# Patient Record
Sex: Male | Born: 1970 | Race: White | Hispanic: No | Smoking: Never smoker
Health system: Southern US, Community
[De-identification: ages and names within clinical notes are randomized; demographics above are authoritative.]

## PROBLEM LIST (undated history)

## (undated) DIAGNOSIS — F32A Depression, unspecified: Secondary | ICD-10-CM

## (undated) DIAGNOSIS — E559 Vitamin D deficiency, unspecified: Secondary | ICD-10-CM

## (undated) DIAGNOSIS — F329 Major depressive disorder, single episode, unspecified: Secondary | ICD-10-CM

## (undated) DIAGNOSIS — K219 Gastro-esophageal reflux disease without esophagitis: Secondary | ICD-10-CM

## (undated) DIAGNOSIS — K449 Diaphragmatic hernia without obstruction or gangrene: Secondary | ICD-10-CM

## (undated) DIAGNOSIS — M109 Gout, unspecified: Secondary | ICD-10-CM

## (undated) DIAGNOSIS — K2210 Ulcer of esophagus without bleeding: Secondary | ICD-10-CM

## (undated) DIAGNOSIS — R5383 Other fatigue: Secondary | ICD-10-CM

## (undated) DIAGNOSIS — F419 Anxiety disorder, unspecified: Secondary | ICD-10-CM

## (undated) DIAGNOSIS — K635 Polyp of colon: Secondary | ICD-10-CM

## (undated) DIAGNOSIS — R55 Syncope and collapse: Secondary | ICD-10-CM

## (undated) DIAGNOSIS — R519 Headache, unspecified: Secondary | ICD-10-CM

## (undated) DIAGNOSIS — E538 Deficiency of other specified B group vitamins: Secondary | ICD-10-CM

## (undated) HISTORY — DX: Other fatigue: R53.83

## (undated) HISTORY — DX: Syncope and collapse: R55

## (undated) HISTORY — DX: Deficiency of other specified B group vitamins: E53.8

## (undated) HISTORY — DX: Headache, unspecified: R51.9

## (undated) HISTORY — DX: Ulcer of esophagus without bleeding: K22.10

## (undated) HISTORY — DX: Depression, unspecified: F32.A

## (undated) HISTORY — DX: Diaphragmatic hernia without obstruction or gangrene: K44.9

## (undated) HISTORY — DX: Anxiety disorder, unspecified: F41.9

## (undated) HISTORY — DX: Polyp of colon: K63.5

## (undated) HISTORY — DX: Major depressive disorder, single episode, unspecified: F32.9

## (undated) HISTORY — PX: COLONOSCOPY: SHX174

## (undated) HISTORY — DX: Vitamin D deficiency, unspecified: E55.9

## (undated) HISTORY — DX: Gastro-esophageal reflux disease without esophagitis: K21.9

## (undated) HISTORY — DX: Gout, unspecified: M10.9

---

## 2003-04-02 ENCOUNTER — Inpatient Hospital Stay (HOSPITAL_COMMUNITY): Admission: EM | Admit: 2003-04-02 | Discharge: 2003-04-05 | Payer: Self-pay | Admitting: Psychiatry

## 2004-03-26 ENCOUNTER — Ambulatory Visit: Payer: Self-pay | Admitting: Psychiatry

## 2004-05-23 ENCOUNTER — Ambulatory Visit: Payer: Self-pay | Admitting: Psychiatry

## 2004-07-18 ENCOUNTER — Ambulatory Visit: Payer: Self-pay | Admitting: Psychiatry

## 2004-10-10 ENCOUNTER — Ambulatory Visit: Payer: Self-pay | Admitting: Psychiatry

## 2005-01-02 ENCOUNTER — Ambulatory Visit: Payer: Self-pay | Admitting: Psychiatry

## 2005-02-13 ENCOUNTER — Ambulatory Visit: Payer: Self-pay | Admitting: Psychology

## 2005-04-03 ENCOUNTER — Ambulatory Visit: Payer: Self-pay | Admitting: Psychiatry

## 2005-06-03 ENCOUNTER — Ambulatory Visit: Payer: Self-pay | Admitting: Psychiatry

## 2005-08-28 ENCOUNTER — Ambulatory Visit (HOSPITAL_COMMUNITY): Payer: Self-pay | Admitting: Psychiatry

## 2008-01-20 ENCOUNTER — Ambulatory Visit (HOSPITAL_COMMUNITY): Payer: Self-pay | Admitting: Psychiatry

## 2008-02-01 ENCOUNTER — Ambulatory Visit (HOSPITAL_COMMUNITY): Payer: Self-pay | Admitting: Psychiatry

## 2008-03-01 ENCOUNTER — Ambulatory Visit (HOSPITAL_COMMUNITY): Payer: Self-pay | Admitting: Psychology

## 2008-03-15 ENCOUNTER — Ambulatory Visit (HOSPITAL_COMMUNITY): Payer: Self-pay | Admitting: Psychology

## 2008-03-23 ENCOUNTER — Ambulatory Visit (HOSPITAL_COMMUNITY): Payer: Self-pay | Admitting: Psychiatry

## 2008-04-11 ENCOUNTER — Ambulatory Visit (HOSPITAL_COMMUNITY): Payer: Self-pay | Admitting: Psychiatry

## 2008-04-13 ENCOUNTER — Ambulatory Visit (HOSPITAL_COMMUNITY): Payer: Self-pay | Admitting: Psychology

## 2008-06-08 ENCOUNTER — Ambulatory Visit (HOSPITAL_COMMUNITY): Payer: Self-pay | Admitting: Psychiatry

## 2008-07-06 ENCOUNTER — Ambulatory Visit (HOSPITAL_COMMUNITY): Payer: Self-pay | Admitting: Psychiatry

## 2008-08-31 ENCOUNTER — Ambulatory Visit (HOSPITAL_COMMUNITY): Payer: Self-pay | Admitting: Psychiatry

## 2008-10-31 ENCOUNTER — Ambulatory Visit (HOSPITAL_COMMUNITY): Payer: Self-pay | Admitting: Psychiatry

## 2008-12-28 ENCOUNTER — Ambulatory Visit (HOSPITAL_COMMUNITY): Payer: Self-pay | Admitting: Psychiatry

## 2009-04-12 ENCOUNTER — Ambulatory Visit (HOSPITAL_COMMUNITY): Payer: Self-pay | Admitting: Psychiatry

## 2009-06-12 ENCOUNTER — Ambulatory Visit (HOSPITAL_COMMUNITY): Payer: Self-pay | Admitting: Psychiatry

## 2009-07-12 ENCOUNTER — Ambulatory Visit (HOSPITAL_COMMUNITY): Payer: Self-pay | Admitting: Psychiatry

## 2009-08-09 ENCOUNTER — Ambulatory Visit (HOSPITAL_COMMUNITY): Payer: Self-pay | Admitting: Psychiatry

## 2009-09-27 ENCOUNTER — Ambulatory Visit (HOSPITAL_COMMUNITY): Payer: Self-pay | Admitting: Psychiatry

## 2009-11-22 ENCOUNTER — Ambulatory Visit (HOSPITAL_COMMUNITY): Payer: Self-pay | Admitting: Psychiatry

## 2010-01-22 ENCOUNTER — Ambulatory Visit (HOSPITAL_COMMUNITY): Payer: Self-pay | Admitting: Psychiatry

## 2010-04-30 ENCOUNTER — Ambulatory Visit (HOSPITAL_COMMUNITY): Payer: Self-pay | Admitting: Psychiatry

## 2010-07-02 ENCOUNTER — Encounter (INDEPENDENT_AMBULATORY_CARE_PROVIDER_SITE_OTHER): Payer: Self-pay | Admitting: Psychiatry

## 2010-07-02 DIAGNOSIS — F331 Major depressive disorder, recurrent, moderate: Secondary | ICD-10-CM

## 2010-08-29 ENCOUNTER — Encounter (INDEPENDENT_AMBULATORY_CARE_PROVIDER_SITE_OTHER): Payer: 59 | Admitting: Psychiatry

## 2010-08-29 DIAGNOSIS — F331 Major depressive disorder, recurrent, moderate: Secondary | ICD-10-CM
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
## 2010-10-29 ENCOUNTER — Encounter (INDEPENDENT_AMBULATORY_CARE_PROVIDER_SITE_OTHER): Payer: 59 | Admitting: Psychiatry

## 2010-10-29 DIAGNOSIS — F331 Major depressive disorder, recurrent, moderate: Secondary | ICD-10-CM

## 2010-12-12 ENCOUNTER — Encounter (INDEPENDENT_AMBULATORY_CARE_PROVIDER_SITE_OTHER): Payer: 59 | Admitting: Psychiatry

## 2010-12-12 DIAGNOSIS — F331 Major depressive disorder, recurrent, moderate: Secondary | ICD-10-CM

## 2010-12-12 NOTE — Progress Notes (Signed)
NAME:  Jimmy Vasquez, Jimmy Vasquez NO.:  [id number]  MEDICAL RECORD NO.:  [id number]  LOCATION:  [facility]                           FACILITY:  [facility]  PHYSICIAN:  Jillian Warth T. Mozetta Murfin, M.D.   DATE OF BIRTH:  [birth year]                                PROGRESS NOTE   Date; 12/12/10 The patient came in today for his appointment.  He endorsed severe depression, social isolation, and anhedonia.  He admitted that he is also not doing very well at job. He has difficulty concentrating at the job.  He endorsed multiple stressors in his life.  His wife decided to leave him and for the past 2 weeks, he has been staying at his dad's old place.  He has also had a lot of pressure from his family members.  His siblings are pressuring him to settle the estate of which he has the power of attorney.  The patient endorsed increased intense pressure from the family and from the wife and has been not doing very well.  He has also been absent from the job for two days, as he does not feel that he can be productive at the place.  He admitted crying spells, poor sleep, racing thoughts, and social isolation.  He has been compliant with his Effexor, however he has stopped the Depakote for the past few months since he complained of numbness in his hand.  His hand numbness has improved since he stopped the Depakote.  LABORATORY DATA: His labs which were drawn on October 30, 2010, came back and within normal limits.  He has normal WBC along with normal liver function tests and kidney tests.  His hemoglobin A1c is 5.6.  MENTAL STATUS EXAM: The patient is casually dressed but maintained fair eye contact.  He is quiet.  His speech is soft but clear and coherent.  He described his mood as depressed, and his affect was constricted.  At times he was tearful when he was talking about his wife, however he denies any auditory hallucinations or active or any passive suicidal thoughts or homicidal thoughts.  There were no  delusions or psychosis present at this time.  His attention and concentration were distracted at times, and he has difficulty answering the questions spontaneously.  He is alert and oriented x3.  His insight, judgment, and impulse control were okay.  DIAGNOSIS: Axis I:  Major depressive disorder, recurrent. Axis II:  Deferred. Axis III:  None. Axis IV:  Moderate. Axis V:  45-50.  PLAN: I reviewed the chart.  The patient had a good response in the past with Seroquel, but he had stopped when he saw the ad on the TV that Seroquel can cause diabetes.  Later we had tried Lamictal, trazodone, and Ambien, but he has stopped taking all these medications due to the side effects. We talked about restarting the Seroquel since it had worked in the past to help his anger, depression, and insomnia and also to help his antidepressant.  We will closely monitor his weight and labs.  Today his weight was 181 and his labs appear to be within normal limits.  I also encouraged him to restart the counseling with  the therapist in this office which he has done in the past.  After some encouragement, the patient agreed to start therapy sessions in this office.  We will take him out from the work until he gets better and the medication gets adjusted in his system.  I also talked in detail about the safety plan. If he has any worsening of symptoms or anytime having suicidal thoughts or homicidal thoughts, then he needs to call 9-1-1, go to the local ER, or call us immediately which he acknowledged.  I explained the risks and benefits of medication in detail, and I will see him again in 10 days.     Riven Mabile T. Lolly Mustache, M.D.     STA/MEDQ  D:  12/12/2010  T:  12/12/2010  Job:  [id number]  Electronically Signed by Kathryne Sharper M.D. on 12/12/2010 01:06:15 PM

## 2010-12-16 ENCOUNTER — Encounter (INDEPENDENT_AMBULATORY_CARE_PROVIDER_SITE_OTHER): Payer: 59 | Admitting: Psychology

## 2010-12-16 DIAGNOSIS — F332 Major depressive disorder, recurrent severe without psychotic features: Secondary | ICD-10-CM

## 2010-12-24 ENCOUNTER — Encounter (INDEPENDENT_AMBULATORY_CARE_PROVIDER_SITE_OTHER): Payer: 59 | Admitting: Psychiatry

## 2010-12-24 ENCOUNTER — Encounter (HOSPITAL_COMMUNITY): Payer: 59 | Admitting: Psychiatry

## 2010-12-24 DIAGNOSIS — F332 Major depressive disorder, recurrent severe without psychotic features: Secondary | ICD-10-CM

## 2010-12-31 ENCOUNTER — Encounter (INDEPENDENT_AMBULATORY_CARE_PROVIDER_SITE_OTHER): Payer: 59 | Admitting: Psychology

## 2010-12-31 DIAGNOSIS — F332 Major depressive disorder, recurrent severe without psychotic features: Secondary | ICD-10-CM

## 2011-01-02 ENCOUNTER — Encounter (HOSPITAL_COMMUNITY): Payer: 59 | Admitting: Psychiatry

## 2011-01-13 ENCOUNTER — Encounter (HOSPITAL_COMMUNITY): Payer: 59 | Admitting: Psychology

## 2011-02-18 ENCOUNTER — Encounter (INDEPENDENT_AMBULATORY_CARE_PROVIDER_SITE_OTHER): Payer: 59 | Admitting: Psychiatry

## 2011-02-18 DIAGNOSIS — F331 Major depressive disorder, recurrent, moderate: Secondary | ICD-10-CM

## 2011-04-15 ENCOUNTER — Encounter (HOSPITAL_COMMUNITY): Payer: Self-pay | Admitting: Psychiatry

## 2011-04-15 ENCOUNTER — Ambulatory Visit (INDEPENDENT_AMBULATORY_CARE_PROVIDER_SITE_OTHER): Payer: 59 | Admitting: Psychiatry

## 2011-04-15 ENCOUNTER — Encounter (HOSPITAL_COMMUNITY): Payer: 59 | Admitting: Psychiatry

## 2011-04-15 DIAGNOSIS — F331 Major depressive disorder, recurrent, moderate: Secondary | ICD-10-CM

## 2011-04-15 MED ORDER — QUETIAPINE FUMARATE 50 MG PO TABS: 50.0000 mg | ORAL_TABLET | Freq: Every day | ORAL | Status: DC

## 2011-04-15 MED ORDER — VENLAFAXINE HCL ER 150 MG PO CP24: 150.0000 mg | ORAL_CAPSULE | Freq: Every day | ORAL | Status: DC

## 2011-04-15 NOTE — Progress Notes (Signed)
Patient came for his followup appointment. He is now taking Seroquel 50 mg at bedtime and Effexor 150 in the morning. He reported 100 mg of Seroquel was making him very dizzy and he decided to cut down his medication on his own. He also cut down his Effexor from 225 to150 as it was causing nausea. Patient reported his current dose of medication is working very well for him he's been more active and denies any depressive symptoms. He is more involved with the family and his job is going very well. She denies any agitation anger or any rage in recent weeks. He is looking toward to have Thanksgiving vacation with his family. I review his medication he is no longer taking medication for gout. He reported he was misdiagnosed by his primary care physician. Since he stopped taking without medication he has no flareup.  Mental status examination Patient is fairly groomed, fair eye contact and appears to be in his his stated age. His speech is soft and slow but clear and coherent. He denies any active or passive suicidal thinking homicidal thinking. There no psychotic symptoms present. His attention and concentration is fair. He described his mood is okay and his affect is constricted. He's alert and oriented x3. His insight judgment and impulse control is okay.  Assessment Maj. depressive disorder  Plan I will reduce his Seroquel to 50 mg and Effexor to 150 mg as he is taking these dosage. I reinforced if she start seeing the signs of anxiety depression that he need to call us immediately. I explained the risks and benefits of medication. He is scheduled to have her blood work on the next year. I will see him again in 3 months.

## 2011-04-26 ENCOUNTER — Other Ambulatory Visit (HOSPITAL_COMMUNITY): Payer: Self-pay

## 2011-07-15 ENCOUNTER — Encounter (HOSPITAL_COMMUNITY): Payer: Self-pay | Admitting: *Deleted

## 2011-07-15 ENCOUNTER — Ambulatory Visit (INDEPENDENT_AMBULATORY_CARE_PROVIDER_SITE_OTHER): Payer: 59 | Admitting: Psychiatry

## 2011-07-15 ENCOUNTER — Encounter (HOSPITAL_COMMUNITY): Payer: Self-pay | Admitting: Psychiatry

## 2011-07-15 VITALS — Wt 182.0 lb

## 2011-07-15 DIAGNOSIS — F329 Major depressive disorder, single episode, unspecified: Secondary | ICD-10-CM

## 2011-07-15 MED ORDER — VENLAFAXINE HCL ER 150 MG PO CP24: 150.0000 mg | ORAL_CAPSULE | Freq: Every day | ORAL | Status: DC

## 2011-07-15 MED ORDER — QUETIAPINE FUMARATE 50 MG PO TABS: 50.0000 mg | ORAL_TABLET | Freq: Every day | ORAL | Status: DC

## 2011-07-15 NOTE — Progress Notes (Signed)
Addended by: Kathryne Sharper T on: 07/15/2011 03:56 PM   Modules accepted: Orders
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
## 2011-08-11 ENCOUNTER — Encounter: Payer: Self-pay | Admitting: Gastroenterology

## 2011-08-19 ENCOUNTER — Other Ambulatory Visit (HOSPITAL_COMMUNITY): Payer: Self-pay | Admitting: *Deleted

## 2011-08-19 DIAGNOSIS — F329 Major depressive disorder, single episode, unspecified: Secondary | ICD-10-CM

## 2011-08-19 MED ORDER — QUETIAPINE FUMARATE 50 MG PO TABS: 50.0000 mg | ORAL_TABLET | Freq: Every day | ORAL | Status: DC

## 2011-08-19 MED ORDER — VENLAFAXINE HCL ER 150 MG PO CP24: 150.0000 mg | ORAL_CAPSULE | Freq: Every day | ORAL | Status: DC

## 2011-08-19 NOTE — Telephone Encounter (Signed)
90 day refill request received by fax.Auth'd by Dr.Arfeen. Instructed pharmacy to d/c refills from RX given on 2/19.

## 2011-08-29 ENCOUNTER — Ambulatory Visit (INDEPENDENT_AMBULATORY_CARE_PROVIDER_SITE_OTHER): Payer: 59 | Admitting: Gastroenterology

## 2011-08-29 ENCOUNTER — Encounter: Payer: Self-pay | Admitting: Gastroenterology

## 2011-08-29 VITALS — BP 112/84 | HR 100 | Ht 74.0 in | Wt 184.0 lb

## 2011-08-29 DIAGNOSIS — R079 Chest pain, unspecified: Secondary | ICD-10-CM

## 2011-08-29 DIAGNOSIS — R195 Other fecal abnormalities: Secondary | ICD-10-CM

## 2011-08-29 DIAGNOSIS — K922 Gastrointestinal hemorrhage, unspecified: Secondary | ICD-10-CM

## 2011-08-29 DIAGNOSIS — K219 Gastro-esophageal reflux disease without esophagitis: Secondary | ICD-10-CM

## 2011-08-29 MED ORDER — PEG-KCL-NACL-NASULF-NA ASC-C 100 G PO SOLR: 1.0000 | Freq: Once | ORAL | Status: DC

## 2011-08-29 MED ORDER — OMEPRAZOLE 20 MG PO CPDR: 20.0000 mg | DELAYED_RELEASE_CAPSULE | Freq: Every day | ORAL | Status: DC
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
## 2011-08-29 NOTE — Patient Instructions (Signed)
You have been scheduled for an endoscopy and colonoscopy with propofol. Please follow the written instructions given to you at your visit today. Please pick up your prep at the pharmacy within the next 1-3 days. Continue to take omeprazole 20 mg one tablet by mouth once daily.  cc: Rudi Heap, MD

## 2011-09-30 ENCOUNTER — Encounter: Payer: Self-pay | Admitting: *Deleted

## 2011-09-30 ENCOUNTER — Encounter: Payer: Self-pay | Admitting: Gastroenterology

## 2011-09-30 ENCOUNTER — Ambulatory Visit (AMBULATORY_SURGERY_CENTER): Payer: 59 | Admitting: Gastroenterology

## 2011-09-30 VITALS — BP 142/90 | HR 79 | Temp 98.9°F | Resp 16 | Ht 74.0 in | Wt 184.0 lb

## 2011-09-30 DIAGNOSIS — R079 Chest pain, unspecified: Secondary | ICD-10-CM

## 2011-09-30 DIAGNOSIS — R195 Other fecal abnormalities: Secondary | ICD-10-CM

## 2011-09-30 DIAGNOSIS — K2210 Ulcer of esophagus without bleeding: Secondary | ICD-10-CM

## 2011-09-30 DIAGNOSIS — D126 Benign neoplasm of colon, unspecified: Secondary | ICD-10-CM

## 2011-09-30 DIAGNOSIS — K922 Gastrointestinal hemorrhage, unspecified: Secondary | ICD-10-CM

## 2011-09-30 MED ORDER — SODIUM CHLORIDE 0.9 % IV SOLN: 500.0000 mL | INTRAVENOUS | Status: DC

## 2011-09-30 MED ORDER — OMEPRAZOLE 40 MG PO CPDR: 40.0000 mg | DELAYED_RELEASE_CAPSULE | Freq: Two times a day (BID) | ORAL | Status: DC

## 2011-09-30 NOTE — Progress Notes (Signed)
PT. Coughing frequently suctioned x3 pt. Stable and transported to recovery. Sitting up in bed HOB elevated pt. Talking as he is being transported to recovery.

## 2011-09-30 NOTE — Progress Notes (Signed)
Patient did not experience any of the following events: a burn prior to discharge; a fall within the facility; wrong site/side/patient/procedure/implant event; or a hospital transfer or hospital admission upon discharge from the facility. (G8907) Patient did not have preoperative order for IV antibiotic SSI prophylaxis. (G8918)  

## 2011-09-30 NOTE — Op Note (Signed)
[facility] [address] [place], [place]  [address]  COLONOSCOPY PROCEDURE REPORT  PATIENT:  Jimmy Vasquez, Jimmy Vasquez  MR#:  [id number] BIRTHDATE:  07/23/1970, 40 yrs. old  GENDER:  male ENDOSCOPIST:  Judie Petit T. Russella Dar, MD, [facility] Referred by:  Rudi Heap, M.D. PROCEDURE DATE:  09/30/2011 PROCEDURE:  Colonoscopy with biopsy and snare polypectomy ASA CLASS:  Class II INDICATIONS:  1) heme positive stool  2) melena MEDICATIONS:   MAC sedation, administered by CRNA, propofol (Diprivan) 500 mg IV DESCRIPTION OF PROCEDURE:   After the risks benefits and alternatives of the procedure were thoroughly explained, informed consent was obtained.  Digital rectal exam was performed and revealed no abnormalities.   The LB CF-[id number] endoscope was introduced through the anus and advanced to the cecum, which was identified by both the appendix and ileocecal valve, without limitations.  The quality of the prep was excellent, using MoviPrep.  The instrument was then slowly withdrawn as the colon was fully examined. <<PROCEDUREIMAGES>> FINDINGS:  A sessile polyp was found in the mid transverse colon. It was 5 mm in size. The polyp was removed using cold biopsy forceps.  A pedunculated polyp was found in the rectum. It was 10 mm in size. Polyp was snared, then cauterized with monopolar cautery. Retrieval was successful. Otherwise normal colonoscopy without other polyps, masses, vascular ectasias, or inflammatory changes. Retroflexed views in the rectum revealed no abnormalities.  The time to cecum =  1.75  minutes. The scope was then withdrawn (time =  13.75  min) from the patient and the procedure completed.  COMPLICATIONS:  None  ENDOSCOPIC IMPRESSION: 1) 5 mm sessile polyp in the mid transverse colon 2) 10 mm pedunculated polyp in the rectum  RECOMMENDATIONS: 1) Hold aspirin, aspirin products, and anti-inflammatory medication for 2 weeks. 2) Await pathology results 3) If the  polyps are adenomatous (pre-cancerous), repeat colonoscopy in 5 years. Otherwise follow colorectal cancer screening guidelines for "routine risk" patients with colonoscopy in 10 years.  Venita Lick. Russella Dar, MD, [facility]  n. eSIGNED:   Venita Lick. Kaisyn Reinhold at 09/30/2011 02:17 PM  Elmarie Shiley, [id number]

## 2011-09-30 NOTE — Patient Instructions (Addendum)
YOU HAD AN ENDOSCOPIC PROCEDURE TODAY AT THE [facility]: Refer to the procedure report that was given to you for any specific questions about what was found during the examination.  If the procedure report does not answer your questions, please call your gastroenterologist to clarify.  If you requested that your care partner not be given the details of your procedure findings, then the procedure report has been included in a sealed envelope for you to review at your convenience later.  YOU SHOULD EXPECT: Some feelings of bloating in the abdomen. Passage of more gas than usual.  Walking can help get rid of the air that was put into your GI tract during the procedure and reduce the bloating. If you had a lower endoscopy (such as a colonoscopy or flexible sigmoidoscopy) you may notice spotting of blood in your stool or on the toilet paper. If you underwent a bowel prep for your procedure, then you may not have a normal bowel movement for a few days.  DIET: Your first meal following the procedure should be a light meal and then it is ok to progress to your normal diet.  A half-sandwich or bowl of soup is an example of a good first meal.  Heavy or fried foods are harder to digest and may make you feel nauseous or bloated.  Likewise meals heavy in dairy and vegetables can cause extra gas to form and this can also increase the bloating.  Drink plenty of fluids but you should avoid alcoholic beverages for 24 hours.  ACTIVITY: Your care partner should take you home directly after the procedure.  You should plan to take it easy, moving slowly for the rest of the day.  You can resume normal activity the day after the procedure however you should NOT DRIVE or use heavy machinery for 24 hours (because of the sedation medicines used during the test).    SYMPTOMS TO REPORT IMMEDIATELY: A gastroenterologist can be reached at any hour.  During normal business hours, 8:30 AM to 5:00 PM Monday through Friday,  call [phone].  After hours and on weekends, please call the GI answering service at [phone] who will take a message and have the physician on call contact you.   Following lower endoscopy (colonoscopy or flexible sigmoidoscopy):  Excessive amounts of blood in the stool  Significant tenderness or worsening of abdominal pains  Swelling of the abdomen that is new, acute  Fever of 100F or higher  Following upper endoscopy (EGD)  Vomiting of blood or coffee ground material  New chest pain or pain under the shoulder blades  Painful or persistently difficult swallowing  New shortness of breath  Fever of 100F or higher  Black, tarry-looking stools  FOLLOW UP: If any biopsies were taken you will be contacted by phone or by letter within the next 1-3 weeks.  Call your gastroenterologist if you have not heard about the biopsies in 3 weeks.  Our staff will call the home number listed on your records the next business day following your procedure to check on you and address any questions or concerns that you may have at that time regarding the information given to you following your procedure. This is a courtesy call and so if there is no answer at the home number and we have not heard from you through the emergency physician on call, we will assume that you have returned to your regular daily activities without incident.  NEW PRESCRIPTION FOR OMEPRAZOLE 40  MG TWICE DAILY HAS BEEN SENT TO YOUR [facility] IN [facility].  HOLD ASPIRIN,ASPIRIN PRODUCTS AND ANTIINFLAMMATORY  MEDICATIONS (ALEVE,MOTRIN,IBUPROFEN) X TWO WEEKS UNTIL MAY 21,2013.  CALL DR. STARK'S OFFICE,[phone], FOR AN APPOINTMENT IN 8 WEEKS.  SIGNATURES/CONFIDENTIALITY: You and/or your care partner have signed paperwork which will be entered into your electronic medical record.  These signatures attest to the fact that that the information above on your After Visit Summary has been reviewed and is understood.  Full  responsibility of the confidentiality of this discharge information lies with you and/or your care-partner.

## 2011-09-30 NOTE — Op Note (Signed)
[facility] [address] [place], [place]  [address]  ENDOSCOPY PROCEDURE REPORT  PATIENT:  Jimmy Vasquez, Jimmy Vasquez  MR#:  [id number] BIRTHDATE:  February 27, 1971, 40 yrs. old  GENDER:  male ENDOSCOPIST:  Judie Petit T. Russella Dar, MD, [facility] Referred by:  Rudi Heap, M.D. PROCEDURE DATE:  09/30/2011 PROCEDURE:  EGD, diagnostic 43235 ASA CLASS:  Class II INDICATIONS:  GERD, melena, hemoccult positive stool MEDICATIONS:  Residual sedation from prior procedure, MAC sedation, administered by CRNA, propofol (Diprivan) 180 TOPICAL ANESTHETIC:  none DESCRIPTION OF PROCEDURE:   After the risks benefits and alternatives of the procedure were thoroughly explained, informed consent was obtained.  The LB GIF-H180 [id number] endoscope was introduced through the mouth and advanced to the second portion of the duodenum, without limitations.  The instrument was slowly withdrawn as the mucosa was fully examined. <<PROCEDUREIMAGES>> Esophagitis was found in the distal esophagus. It was erosive. LA Class B. Otherwise normal esophagus.  The stomach was entered and closely examined. The pylorus, antrum, angularis, and lesser curvature were well visualized, including a retroflexed view of the cardia and fundus. The stomach wall was normally distensable. The scope passed easily through the pylorus into the duodenum. The duodenal bulb was normal in appearance, as was the postbulbar duodenum. Retroflexed views revealed a hiatal hernia, 4 cm.  The scope was then withdrawn from the patient and the procedure completed.  COMPLICATIONS:  None  ENDOSCOPIC IMPRESSION: 1) Erosive esophagitis 2) Hiatal hernia  RECOMMENDATIONS: 1) Anti-reflux regimen 2) PPI bid: omeprazole 40 mg po bid, #60, 11 refills 3) OP follow-up in 8 weeks  Lonna Rabold T. Russella Dar, MD, [facility]  n. eSIGNED:   Venita Lick. Havannah Streat at 09/30/2011 02:22 PM  Elmarie Shiley, [phone]

## 2011-10-01 ENCOUNTER — Telehealth: Payer: Self-pay | Admitting: *Deleted

## 2011-10-01 NOTE — Telephone Encounter (Signed)
Left message

## 2011-10-07 ENCOUNTER — Encounter: Payer: Self-pay | Admitting: Gastroenterology

## 2011-10-14 ENCOUNTER — Ambulatory Visit (HOSPITAL_COMMUNITY): Payer: 59 | Admitting: Psychiatry

## 2011-10-16 ENCOUNTER — Ambulatory Visit (HOSPITAL_COMMUNITY): Payer: Self-pay | Admitting: Psychiatry

## 2011-10-16 ENCOUNTER — Ambulatory Visit (HOSPITAL_COMMUNITY): Payer: 59 | Admitting: Psychiatry

## 2011-10-21 ENCOUNTER — Ambulatory Visit (HOSPITAL_COMMUNITY): Payer: Self-pay | Admitting: Psychiatry

## 2011-11-20 ENCOUNTER — Ambulatory Visit (INDEPENDENT_AMBULATORY_CARE_PROVIDER_SITE_OTHER): Payer: 59 | Admitting: Gastroenterology

## 2011-11-20 ENCOUNTER — Encounter: Payer: Self-pay | Admitting: Gastroenterology

## 2011-11-20 VITALS — BP 126/90 | HR 84 | Ht 74.0 in | Wt 184.6 lb

## 2011-11-20 DIAGNOSIS — K2100 Gastro-esophageal reflux disease with esophagitis, without bleeding: Secondary | ICD-10-CM

## 2011-11-20 DIAGNOSIS — K2210 Ulcer of esophagus without bleeding: Secondary | ICD-10-CM

## 2011-11-20 DIAGNOSIS — K208 Other esophagitis without bleeding: Secondary | ICD-10-CM

## 2011-11-20 MED ORDER — OMEPRAZOLE 40 MG PO CPDR: 40.0000 mg | DELAYED_RELEASE_CAPSULE | Freq: Every day | ORAL | Status: DC

## 2011-11-20 NOTE — Patient Instructions (Addendum)
Decrease your omeprazole to one tablet by mouth once daily  long-term. cc: Rudi Heap, MD

## 2011-11-20 NOTE — Progress Notes (Signed)
History of Present Illness: This is a 41 year old male with erosive esophagitis and hamartomatous colon polyps. His reflux symptoms are under excellent control on omeprazole 40 mg twice daily. He has no GI complaints.  Current Medications, Allergies, Past Medical History, Past Surgical History, Family History and Social History were reviewed in [employer] record.  Physical Exam: General: Well developed , well nourished, no acute distress Head: Normocephalic and atraumatic Eyes:  sclerae anicteric, EOMI Ears: Normal auditory acuity Mouth: No deformity or lesions Lungs: Clear throughout to auscultation Heart: Regular rate and rhythm; no murmurs, rubs or bruits Abdomen: Soft, non tender and non distended. No masses, hepatosplenomegaly or hernias noted. Normal Bowel sounds Musculoskeletal: Symmetrical with no gross deformities  Extremities: No clubbing, cyanosis, edema or deformities noted Neurological: Alert oriented x 4, grossly nonfocal Psychological:  Alert and cooperative. Normal mood and affect  Assessment and Recommendations:  1. GERD with erosive esophagitis. Long-term antireflux measures. Change to omeprazole 40 mg daily long-term.  2. Colorectal cancer screening, average risk. Surveillance colonoscopy in 10 years.

## 2012-09-20 ENCOUNTER — Telehealth: Payer: Self-pay | Admitting: Nurse Practitioner
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
## 2012-10-06 ENCOUNTER — Telehealth: Payer: Self-pay | Admitting: Nurse Practitioner

## 2012-10-06 NOTE — Telephone Encounter (Signed)
appt given  

## 2012-10-07 ENCOUNTER — Ambulatory Visit (INDEPENDENT_AMBULATORY_CARE_PROVIDER_SITE_OTHER): Payer: 59

## 2012-10-07 ENCOUNTER — Encounter: Payer: Self-pay | Admitting: Nurse Practitioner

## 2012-10-07 ENCOUNTER — Ambulatory Visit (INDEPENDENT_AMBULATORY_CARE_PROVIDER_SITE_OTHER): Payer: 59 | Admitting: Nurse Practitioner

## 2012-10-07 VITALS — BP 140/90 | HR 64 | Temp 97.8°F | Ht 73.0 in | Wt 180.2 lb

## 2012-10-07 DIAGNOSIS — R21 Rash and other nonspecific skin eruption: Secondary | ICD-10-CM

## 2012-10-07 DIAGNOSIS — R079 Chest pain, unspecified: Secondary | ICD-10-CM

## 2012-10-07 DIAGNOSIS — R0781 Pleurodynia: Secondary | ICD-10-CM

## 2012-10-07 MED ORDER — PREDNISONE 20 MG PO TABS
ORAL_TABLET | ORAL | Status: DC
Start: 2012-10-07 — End: 2016-04-07
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
## 2012-10-07 NOTE — Patient Instructions (Signed)
Rash  A rash is a change in the color or texture of your skin. There are many different types of rashes. You may have other problems that accompany your rash.  CAUSES     Infections.   Allergic reactions. This can include allergies to pets or foods.   Certain medicines.   Exposure to certain chemicals, soaps, or cosmetics.   Heat.   Exposure to poisonous plants.   Tumors, both cancerous and noncancerous.  SYMPTOMS     Redness.   Scaly skin.   Itchy skin.   Dry or cracked skin.   Bumps.   Blisters.   Pain.  DIAGNOSIS   Your caregiver may do a physical exam to determine what type of rash you have. A skin sample (biopsy) may be taken and examined under a microscope.  TREATMENT    Treatment depends on the type of rash you have. Your caregiver may prescribe certain medicines. For serious conditions, you may need to see a skin doctor (dermatologist).  HOME CARE INSTRUCTIONS     Avoid the substance that caused your rash.   Do not scratch your rash. This can cause infection.   You may take cool baths to help stop itching.   Only take over-the-counter or prescription medicines as directed by your caregiver.   Keep all follow-up appointments as directed by your caregiver.  SEEK IMMEDIATE MEDICAL CARE IF:   You have increasing pain, swelling, or redness.   You have a fever.   You have new or severe symptoms.   You have body aches, diarrhea, or vomiting.   Your rash is not better after 3 days.  MAKE SURE YOU:   Understand these instructions.   Will watch your condition.   Will get help right away if you are not doing well or get worse.  Document Released: 05/02/2002 Document Revised: 08/04/2011 Document Reviewed: 02/24/2011  ExitCare Patient Information 2013 [employer].

## 2014-10-30 IMAGING — CR DG CHEST 2V
2 series · 2 of 2 positions shown · non-contrast
Comparison: None

CLINICAL DATA: Left rib pain

CHEST - 2 VIEW

[view not recorded (1 of 2)]
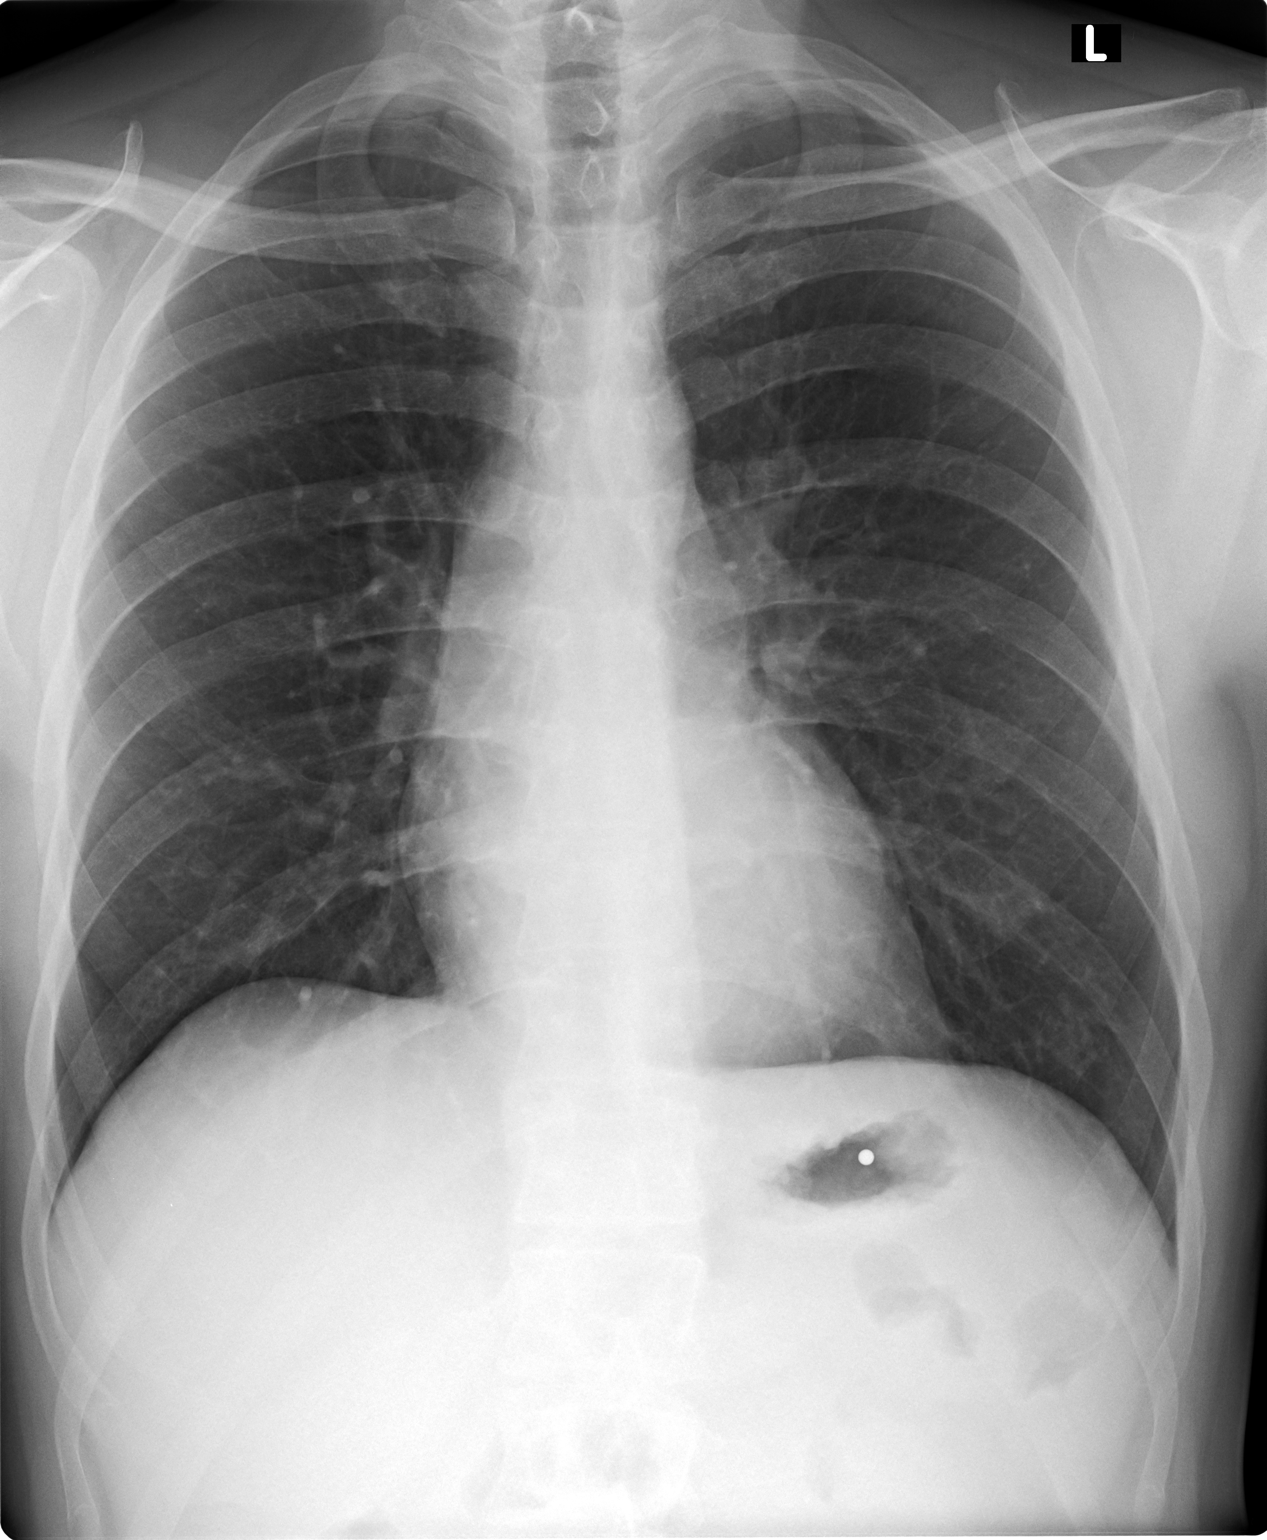

[view not recorded (2 of 2)]
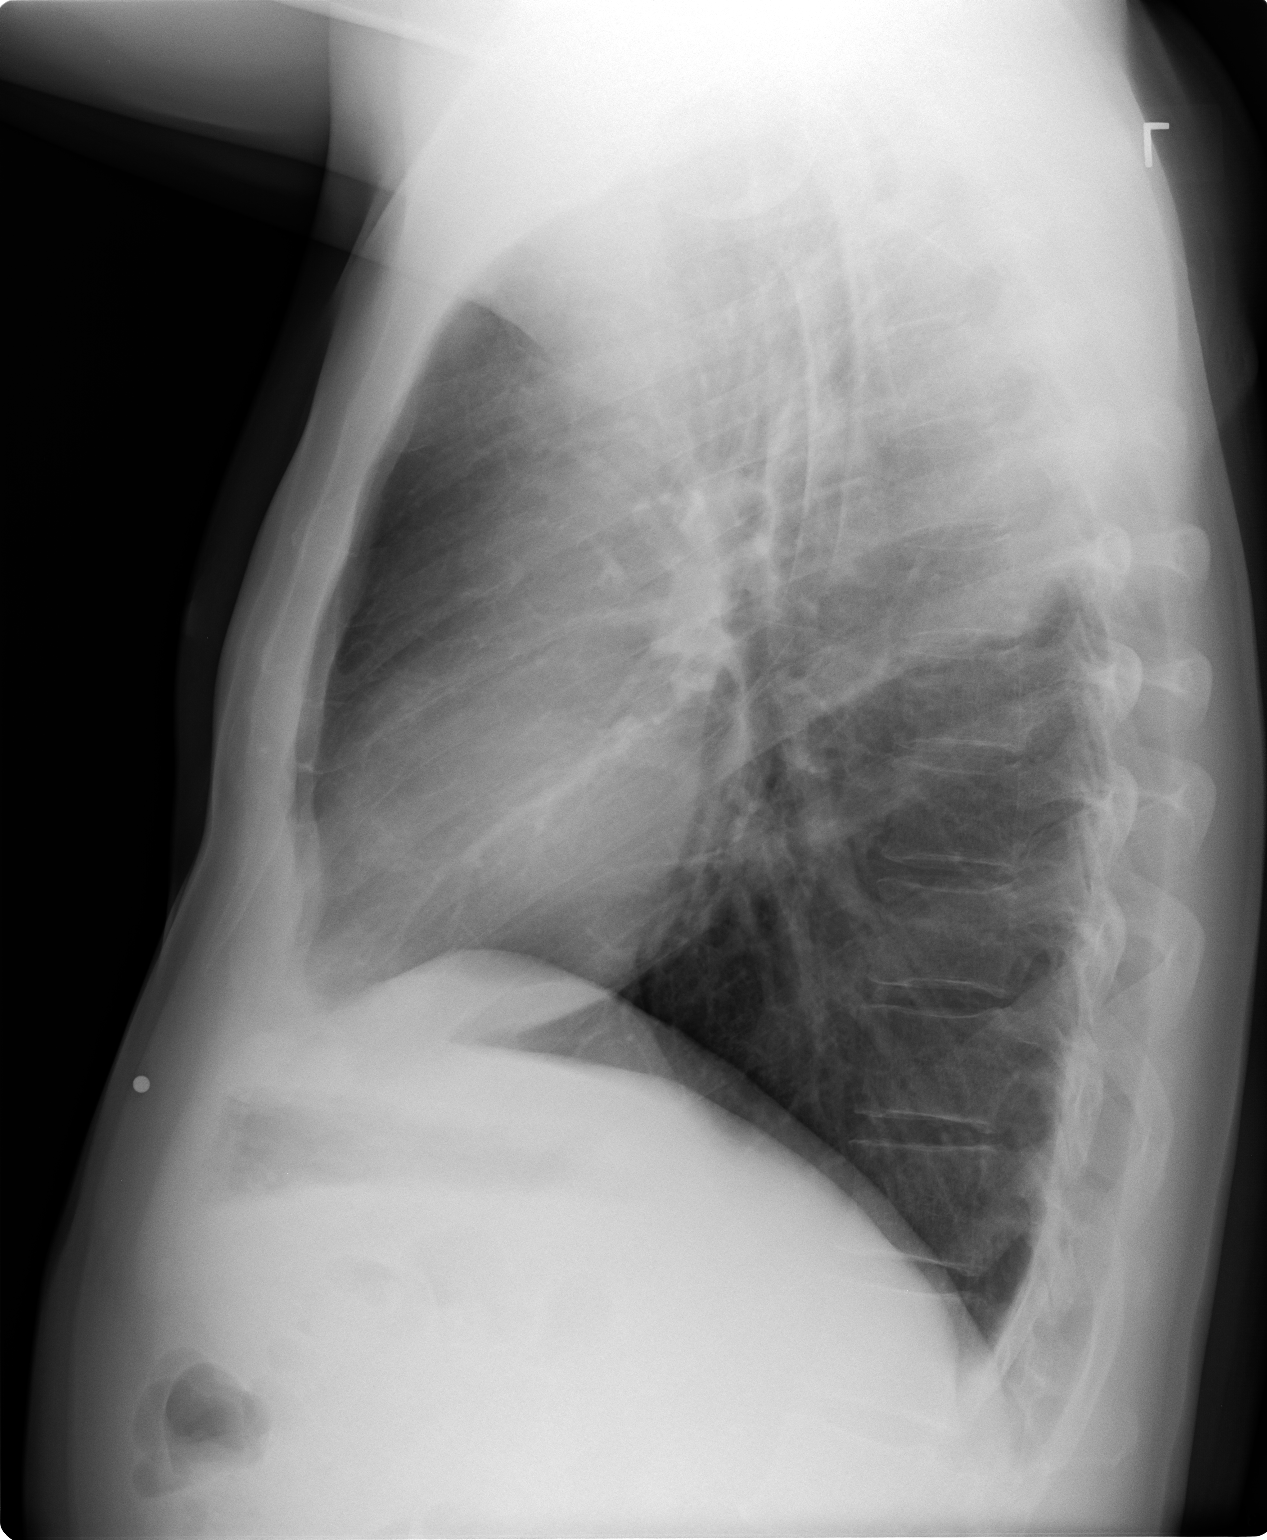

[2 of 2 positions shown; findings below may reference images not displayed]

FINDINGS: Normal heart size, mediastinal contours, and pulmonary vascularity.
Lungs clear.
No pleural effusion or pneumothorax.
Bones unremarkable.
BB placed at site of symptoms lower anterior left chest, overlying
anterior costal cartilage.
No focal rib abnormalities seen.
IMPRESSION: Normal exam.

## 2016-04-07 ENCOUNTER — Ambulatory Visit (INDEPENDENT_AMBULATORY_CARE_PROVIDER_SITE_OTHER): Payer: Commercial Managed Care - HMO | Admitting: Pulmonary Disease

## 2016-04-07 ENCOUNTER — Encounter: Payer: Self-pay | Admitting: Pulmonary Disease

## 2016-04-07 VITALS — BP 128/84 | HR 101 | Ht 74.0 in | Wt 180.4 lb

## 2016-04-07 DIAGNOSIS — K2100 Gastro-esophageal reflux disease with esophagitis, without bleeding: Secondary | ICD-10-CM

## 2016-04-07 DIAGNOSIS — R0602 Shortness of breath: Secondary | ICD-10-CM

## 2016-04-07 MED ORDER — ESOMEPRAZOLE MAGNESIUM 40 MG PO CPDR: 40.0000 mg | DELAYED_RELEASE_CAPSULE | Freq: Every day | ORAL | 2 refills | Status: DC

## 2016-04-07 NOTE — Patient Instructions (Signed)
We will schedule you for pulmonary function tests. Continue using the Anoro as prescribed. We start you on Nexium 40 mg a day and refer you back to Dr. Fuller Plan for evaluation of acid reflux  Return in 1 month

## 2016-04-07 NOTE — Progress Notes (Signed)
Jimmy Vasquez    [id number]    1971/01/29  Primary Care Physician:No primary care provider on file.  Referring Physician: Rory Percy, MD [place], [place] [id number]  Chief complaint:  Consult for evaluation of dyspnea, cough ? COPD  HPI: Jimmy Vasquez is a 45 year old with complaints of dyspnea with wheezing, coughing. The coughing occurs usually at night. It wakes him up at 3 AM and is associated with clear mucus. He denies any fevers, chills. 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 He also has history of erosive esophagitis, hiatal hernia seen on an EGD in 2013. He was on omeprazole but has stopped taking it since 2014.  He had a chest x-ray at his primary care office which has a suggestion of hyperinflation. Spirometry spirometry show mild obstruction. He's been using Anoro for the past month with no change in symptoms.  Outpatient Encounter Prescriptions as of 04/07/2016  Medication Sig  . albuterol (VENTOLIN HFA) 108 (90 Base) MCG/ACT inhaler Inhale 2 puffs into the lungs every 6 (six) hours as needed for wheezing or shortness of breath.  . allopurinol (ZYLOPRIM) 300 MG tablet Take 300 mg by mouth daily.  . colchicine 0.6 MG tablet Take 0.6 mg by mouth daily as needed. Or as directed  . umeclidinium-vilanterol (ANORO ELLIPTA) 62.5-25 MCG/INH AEPB Inhale 1 puff into the lungs daily.  . [DISCONTINUED] predniSONE (DELTASONE) 20 MG tablet 2 Po QD- at the same time everyday (Patient not taking: Reported on 04/07/2016)   No facility-administered encounter medications on file as of 04/07/2016.     Allergies as of 04/07/2016 - Review Complete 04/07/2016  Allergen Reaction Noted  . Lamictal [lamotrigine] Rash 04/15/2011    Past Medical History:  Diagnosis Date  . Colonic polyp    Hamartomatous colon polyp  . Depression   . Erosive esophagitis   . Gout   . Hiatal hernia     History  reviewed. No pertinent surgical history.  Family History  Problem Relation Age of Onset  . Alzheimer's disease Father   . Cancer Maternal Grandfather   . Alzheimer's disease Paternal Grandmother   . Alzheimer's disease Paternal Grandfather     Social History   Social History  . Marital status: Married    Spouse name: N/A  . Number of children: 2  . Years of education: N/A   Occupational History  . [employer]   Social History Main Topics  . Smoking status: Never Smoker  . Smokeless tobacco: Never Used  . Alcohol use Yes     Comment: 6 beers per day  . Drug use: No  . Sexual activity: Not on file   Other Topics Concern  . Not on file   Social History Narrative   Separated, lives alone   2 children   OCCUPATION: fixer [employer]     Review of systems: Review of Systems  Constitutional: Negative for fever and chills.  HENT: Negative.   Eyes: Negative for blurred vision.  Respiratory: as per HPI  Cardiovascular: Negative for chest pain and palpitations.  Gastrointestinal: Negative for vomiting, diarrhea, blood per rectum. Genitourinary: Negative for dysuria, urgency, frequency and hematuria.  Musculoskeletal: Negative for myalgias, back pain and joint pain.  Skin: Negative for itching and rash.  Neurological: Negative for dizziness, tremors, focal weakness, seizures and loss of consciousness.  Endo/Heme/Allergies: Negative for environmental  allergies.  Psychiatric/Behavioral: Negative for depression, suicidal ideas and hallucinations.  All other systems reviewed and are negative.   Physical Exam: Blood pressure 128/84, pulse (!) 101, height 6\' 2"  (1.88 m), weight 180 lb 6.4 oz (81.8 kg), SpO2 97 %. Gen:      No acute distress HEENT:  EOMI, sclera anicteric Neck:     No masses; no thyromegaly Lungs:    Clear to auscultation bilaterally; normal respiratory effort CV:         Regular rate and rhythm; no murmurs Abd:      + bowel sounds; soft,  non-tender; no palpable masses, no distension Ext:    No edema; adequate peripheral perfusion Skin:      Warm and dry; no rash Neuro: alert and oriented x 3 Psych: normal mood and affect  Data Reviewed: PFTs 03/11/16 FVC 4.54 (79%) FEV1 3.17 (70%) F/F 70 Moderate obstruction. Only one acceptable maneuver-interpreted with caution.  Chest x-ray 03/11/16-no acute cardiopulmonary disease. Images reviewed.  Assessment:  #1 Evaluation for dyspnea, nocturnal cough. Chest x-ray suggestive of hyperinflation and spirometry shows obstruction but as he had only one acceptable maneuver during this procedure it is difficult to interpret this. I'll get a full set of pulmonary function tests for further evaluation. He continues Anoro in the meantime. Return to clinic in 1 month for discussion of results.  #2 GERD, erosive esophagitis I suspect his nocturnal cough may be related to acid reflux as he is known to have GERD with erosive esophagitis. He hasn't been taking his PPI for the past 3 years. I'll restart him on Nexium 40 mg a day and refer him back to GI Dr. Fuller Plan for further evaluation.  Plan/Recommendations: - Continue Anoro - Full PFTs - Start Nexium 40 mg/day - Follow up with GI  Marshell Garfinkel MD [facility] Pager [phone] 04/07/2016, 1:58 PM  CC: Rory Percy, MD

## 2016-05-08 ENCOUNTER — Ambulatory Visit (HOSPITAL_COMMUNITY)
Admission: RE | Admit: 2016-05-08 | Discharge: 2016-05-08 | Disposition: A | Discharge status: Home / Self Care | Payer: Commercial Managed Care - HMO | Source: Ambulatory Visit | Attending: Pulmonary Disease | Admitting: Pulmonary Disease

## 2016-05-08 ENCOUNTER — Encounter: Payer: Self-pay | Admitting: Pulmonary Disease

## 2016-05-08 ENCOUNTER — Ambulatory Visit (INDEPENDENT_AMBULATORY_CARE_PROVIDER_SITE_OTHER): Payer: Commercial Managed Care - HMO | Admitting: Pulmonary Disease

## 2016-05-08 VITALS — BP 122/82 | HR 90 | Ht 73.0 in | Wt 182.4 lb

## 2016-05-08 DIAGNOSIS — J449 Chronic obstructive pulmonary disease, unspecified: Secondary | ICD-10-CM | POA: Insufficient documentation

## 2016-05-08 DIAGNOSIS — K2100 Gastro-esophageal reflux disease with esophagitis, without bleeding: Secondary | ICD-10-CM

## 2016-05-08 DIAGNOSIS — R0602 Shortness of breath: Secondary | ICD-10-CM

## 2016-05-08 LAB — PULMONARY FUNCTION TEST
DL/VA % pred: 94 %
DL/VA: 4.47 ml/min/mmHg/L
DLCO UNC % PRED: 99 %
DLCO UNC: 33.66 ml/min/mmHg
FEF 25-75 PRE: 4.01 L/s
FEF 25-75 Post: 4.8 L/sec
FEF2575-%Change-Post: 19 %
FEF2575-%PRED-POST: 127 %
FEF2575-%Pred-Pre: 106 %
FEV1-%Change-Post: 5 %
FEV1-%PRED-POST: 114 %
FEV1-%PRED-PRE: 108 %
FEV1-POST: 4.78 L
FEV1-Pre: 4.53 L
FEV1FVC-%Change-Post: 3 %
FEV1FVC-%Pred-Pre: 97 %
FEV6-%CHANGE-POST: 1 %
FEV6-%PRED-POST: 115 %
FEV6-%Pred-Pre: 113 %
FEV6-Post: 5.95 L
FEV6-Pre: 5.87 L
FEV6FVC-%CHANGE-POST: 0 %
FEV6FVC-%PRED-POST: 102 %
FEV6FVC-%PRED-PRE: 103 %
FVC-%Change-Post: 1 %
FVC-%Pred-Post: 111 %
FVC-%Pred-Pre: 110 %
FVC-PRE: 5.87 L
FVC-Post: 5.96 L
POST FEV1/FVC RATIO: 80 %
POST FEV6/FVC RATIO: 100 %
PRE FEV6/FVC RATIO: 100 %
Pre FEV1/FVC ratio: 77 %
RV % PRED: 81 %
RV: 1.64 L
TLC % PRED: 106 %
TLC: 7.59 L

## 2016-05-08 MED ORDER — ALBUTEROL SULFATE (2.5 MG/3ML) 0.083% IN NEBU
2.5000 mg | INHALATION_SOLUTION | Freq: Once | RESPIRATORY_TRACT | Status: AC
  Administered 2016-05-08: 2.5 mg via RESPIRATORY_TRACT

## 2016-05-08 NOTE — Progress Notes (Signed)
Jimmy Vasquez    [id number]    1970/11/27  Primary Care Physician:HOWARD, Lennette Bihari, MD  Referring Physician: No referring provider defined for this encounter.  Chief complaint:  Follow up for dyspnea, cough ? COPD  HPI: Jimmy Vasquez is a 45 year old with complaints of dyspnea with wheezing, coughing. The coughing occurs usually at night. It wakes him up at 3 AM and is associated with clear mucus. He denies any fevers, chills. 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 He also has history of erosive esophagitis, hiatal hernia seen on an EGD in 2013. He was on omeprazole but has stopped taking it since 2014.  He had a chest x-ray at his primary care office which has a suggestion of hyperinflation. Spirometry spirometry show mild obstruction. He's been using Anoro for the past 2 months with no change in symptoms.  Outpatient Encounter Prescriptions as of 05/08/2016  Medication Sig  . albuterol (VENTOLIN HFA) 108 (90 Base) MCG/ACT inhaler Inhale 2 puffs into the lungs every 6 (six) hours as needed for wheezing or shortness of breath.  . allopurinol (ZYLOPRIM) 300 MG tablet Take 300 mg by mouth daily.  . colchicine 0.6 MG tablet Take 0.6 mg by mouth daily as needed. Or as directed  . esomeprazole (NEXIUM) 40 MG capsule Take 1 capsule (40 mg total) by mouth daily.  Marland Kitchen umeclidinium-vilanterol (ANORO ELLIPTA) 62.5-25 MCG/INH AEPB Inhale 1 puff into the lungs daily.   No facility-administered encounter medications on file as of 05/08/2016.     Allergies as of 05/08/2016 - Review Complete 05/08/2016  Allergen Reaction Noted  . Lamictal [lamotrigine] Rash 04/15/2011    Past Medical History:  Diagnosis Date  . Colonic polyp    Hamartomatous colon polyp  . Depression   . Erosive esophagitis   . Gout   . Hiatal hernia     No past surgical history on file.  Family History  Problem Relation Age of Onset    . Alzheimer's disease Father   . Cancer Maternal Grandfather   . Alzheimer's disease Paternal Grandmother   . Alzheimer's disease Paternal Grandfather     Social History   Social History  . Marital status: Legally Separated    Spouse name: N/A  . Number of children: 2  . Years of education: N/A   Occupational History  . [employer]   Social History Main Topics  . Smoking status: Never Smoker  . Smokeless tobacco: Never Used  . Alcohol use Yes     Comment: 6 beers per day  . Drug use: No  . Sexual activity: Not on file   Other Topics Concern  . Not on file   Social History Narrative   Separated, lives alone   2 children   OCCUPATION: fixer [employer]   Review of systems: Review of Systems  Constitutional: Negative for fever and chills.  HENT: Negative.   Eyes: Negative for blurred vision.  Respiratory: as per HPI  Cardiovascular: Negative for chest pain and palpitations.  Gastrointestinal: Negative for vomiting, diarrhea, blood per rectum. Genitourinary: Negative for dysuria, urgency, frequency and hematuria.  Musculoskeletal: Negative for myalgias, back pain and joint pain.  Skin: Negative for itching and rash.  Neurological: Negative for dizziness, tremors, focal weakness, seizures and loss of consciousness.  Endo/Heme/Allergies: Negative for environmental allergies.  Psychiatric/Behavioral: Negative for depression, suicidal ideas and hallucinations.  All  other systems reviewed and are negative.  Physical Exam: Blood pressure 128/84, pulse (!) 101, height 6\' 2"  (1.88 m), weight 180 lb 6.4 oz (81.8 kg), SpO2 97 %. Gen:      No acute distress HEENT:  EOMI, sclera anicteric Neck:     No masses; no thyromegaly Lungs:    Clear to auscultation bilaterally; normal respiratory effort CV:         Regular rate and rhythm; no murmurs Abd:      + bowel sounds; soft, non-tender; no palpable masses, no distension Ext:    No edema; adequate peripheral  perfusion Skin:      Warm and dry; no rash Neuro: alert and oriented x 3 Psych: normal mood and affect  Data Reviewed: PFTs 03/11/16 FVC 4.54 (79%) FEV1 3.17 (70%) F/F 70 Moderate obstruction. Only one acceptable maneuver-interpreted with caution.  PFTs 05/08/16 FVC 5.96 [111%) FEV1 4.78 [114%) F/F 80 TLC 106% DLCO 99% No obstruction. No restriction or diffusion impairment.  Chest x-ray 03/11/16-no acute cardiopulmonary disease. Images reviewed.  Assessment:  #1 Evaluation for dyspnea, nocturnal cough. Chest x-ray suggestive of hyperinflation and spirometry showed obstruction but as he had only one acceptable maneuver during this procedure it is difficult to interpret this.  Repeat set of PFTs were reviewed with him today. They are normal with no obstruction, restriction or diffusion impairment. I do not believe he has COPD. I'll stop the anoro as it is not making a difference. I suspect the dyspnea is secondary to deconditioning.  #2 GERD, erosive esophagitis His nocturnal cough may be related to acid reflux as he is known to have GERD with erosive esophagitis. He hasn't been taking his PPI for the past 3 years. He has restarted the Nexium 40 mg a day and is due to see GI Dr. Fuller Plan for further evaluation.  Plan/Recommendations: - Stop Anoro - Continue Nexium 40 mg/day - Follow up with GI  Marshell Garfinkel MD [facility] Pager [phone] 05/08/2016, 3:11 PM  CC: No ref. provider found

## 2016-05-08 NOTE — Patient Instructions (Signed)
Stop using the Anoro inhaler. Continue the albuterol PRN  Follow up in 6 months

## 2016-06-04 ENCOUNTER — Encounter: Payer: Self-pay | Admitting: Gastroenterology

## 2016-06-04 ENCOUNTER — Ambulatory Visit (INDEPENDENT_AMBULATORY_CARE_PROVIDER_SITE_OTHER): Payer: Commercial Managed Care - HMO | Admitting: Gastroenterology

## 2016-06-04 VITALS — BP 110/80 | HR 68 | Ht 73.0 in | Wt 183.8 lb

## 2016-06-04 DIAGNOSIS — K2100 Gastro-esophageal reflux disease with esophagitis, without bleeding: Secondary | ICD-10-CM

## 2016-06-04 DIAGNOSIS — R05 Cough: Secondary | ICD-10-CM

## 2016-06-04 DIAGNOSIS — R059 Cough, unspecified: Secondary | ICD-10-CM

## 2016-06-04 MED ORDER — ESOMEPRAZOLE MAGNESIUM 40 MG PO CPDR: 40.0000 mg | DELAYED_RELEASE_CAPSULE | Freq: Two times a day (BID) | ORAL | 11 refills | Status: AC

## 2016-06-04 NOTE — Progress Notes (Signed)
History of Present Illness: This is a 46 year old male referred by Marshell Garfinkel, MD for the evaluation of cough, throat clearing. He is noted a cough particularly at night for the past several months. He was diagnosed with GERD and erosive esophagitis in 2013. See EGD below. The patient's AIDS took the prescribed medications for several months and has not been on any medications for GERD since that time. Nexium 40 mg daily was started by Dr. Vaughan Browner 2 months ago and the patient has had an improvement in cough and throat clearing however has not resolved. Denies weight loss, abdominal pain, constipation, diarrhea, change in stool caliber, melena, hematochezia, nausea, vomiting, dysphagia, chest pain.   EGD 09/2011 showed LA Class B erosive esophagitis and a hiatal hernia.   Allergies  Allergen Reactions  . Lamictal [Lamotrigine] Rash   Outpatient Medications Prior to Visit  Medication Sig Dispense Refill  . allopurinol (ZYLOPRIM) 300 MG tablet Take 300 mg by mouth daily.    . colchicine 0.6 MG tablet Take 0.6 mg by mouth daily as needed. Or as directed    . esomeprazole (NEXIUM) 40 MG capsule Take 1 capsule (40 mg total) by mouth daily. 30 capsule 2  . albuterol (VENTOLIN HFA) 108 (90 Base) MCG/ACT inhaler Inhale 2 puffs into the lungs every 6 (six) hours as needed for wheezing or shortness of breath.    . umeclidinium-vilanterol (ANORO ELLIPTA) 62.5-25 MCG/INH AEPB Inhale 1 puff into the lungs daily.     No facility-administered medications prior to visit.    Past Medical History:  Diagnosis Date  . Colonic polyp    Hamartomatous colon polyp  . Depression   . Erosive esophagitis   . Gout   . Hiatal hernia    History reviewed. No pertinent surgical history. Social History   Social History  . Marital status: Legally Separated    Spouse name: N/A  . Number of children: 2  . Years of education: N/A   Occupational History  . [employer]   Social History Main  Topics  . Smoking status: Never Smoker  . Smokeless tobacco: Never Used  . Alcohol use Yes     Comment: 6 beers per day  . Drug use: No  . Sexual activity: Not Asked   Other Topics Concern  . None   Social History Narrative   Separated, lives alone   2 children   OCCUPATION: fixer [employer]   Family History  Problem Relation Age of Onset  . Alzheimer's disease Father   . Cancer Maternal Grandfather   . Alzheimer's disease Paternal Grandmother   . Alzheimer's disease Paternal Grandfather       Review of Systems: Pertinent positive and negative review of systems were noted in the above HPI section. All other review of systems were otherwise negative.  Physical Exam: General: Well developed, well nourished, no acute distress Head: Normocephalic and atraumatic Eyes:  sclerae anicteric, EOMI Ears: Normal auditory acuity Mouth: No deformity or lesions Neck: Supple, no masses or thyromegaly Lungs: Clear throughout to auscultation Heart: Regular rate and rhythm; no murmurs, rubs or bruits Abdomen: Soft, non tender and non distended. No masses, hepatosplenomegaly or hernias noted. Normal Bowel sounds Musculoskeletal: Symmetrical with no gross deformities  Skin: No lesions on visible extremities Pulses:  Normal pulses noted Extremities: No clubbing, cyanosis, edema or deformities noted Neurological: Alert oriented x 4, grossly nonfocal Cervical Nodes:  No significant cervical adenopathy Inguinal Nodes: No significant inguinal adenopathy Psychological:  Alert and cooperative. Normal mood and affect  Assessment and Recommendations:  1. GERD with a history of LA Class B erosive esophagitis. Possible LPR leading to cough, throat clearing. R/O ENT etiologies. Increase Nexium to 40 mg po bid before meal. Follow antireflux measures. Add ranitidine 300 mg at bedtime if symptoms not improving. REV in 2 months.   cc: Marshell Garfinkel. MD

## 2016-06-04 NOTE — Patient Instructions (Signed)
Increase your Nexium to 40 mg one capsule by mouth twice daily. A new prescription has been sent to your pharmacy.   Patient advised to avoid spicy, acidic, citrus, chocolate, mints, fruit and fruit juices.  Limit the intake of caffeine, alcohol and Soda.  Don't exercise too soon after eating.  Don't lie down within 3-4 hours of eating.  Elevate the head of your bed.  Thank you for choosing me and [facility].  Pricilla Riffle. Dagoberto Ligas., MD., Marval Regal

## 2016-08-07 ENCOUNTER — Ambulatory Visit: Payer: Self-pay | Admitting: Gastroenterology

## 2016-08-10 DIAGNOSIS — F3341 Major depressive disorder, recurrent, in partial remission: Secondary | ICD-10-CM | POA: Insufficient documentation

## 2019-02-09 DIAGNOSIS — M1A00X0 Idiopathic chronic gout, unspecified site, without tophus (tophi): Secondary | ICD-10-CM | POA: Insufficient documentation

## 2019-09-08 ENCOUNTER — Encounter: Payer: Self-pay | Admitting: Cardiovascular Disease

## 2019-09-27 ENCOUNTER — Encounter: Payer: Self-pay | Admitting: Cardiovascular Disease

## 2019-10-11 ENCOUNTER — Encounter: Payer: Self-pay | Admitting: Cardiovascular Disease

## 2019-10-11 ENCOUNTER — Ambulatory Visit (INDEPENDENT_AMBULATORY_CARE_PROVIDER_SITE_OTHER): Payer: 59 | Admitting: Cardiovascular Disease

## 2019-10-11 ENCOUNTER — Other Ambulatory Visit: Payer: Self-pay

## 2019-10-11 VITALS — BP 132/90 | HR 93 | Ht 74.0 in | Wt 183.0 lb

## 2019-10-11 DIAGNOSIS — R55 Syncope and collapse: Secondary | ICD-10-CM | POA: Diagnosis not present

## 2019-10-11 DIAGNOSIS — R9439 Abnormal result of other cardiovascular function study: Secondary | ICD-10-CM | POA: Diagnosis not present

## 2019-10-11 DIAGNOSIS — I429 Cardiomyopathy, unspecified: Secondary | ICD-10-CM

## 2019-10-11 DIAGNOSIS — R079 Chest pain, unspecified: Secondary | ICD-10-CM

## 2019-10-11 NOTE — Patient Instructions (Signed)
Medication Instructions:  Continue all current medications.  Labwork: none  Testing/Procedures:  Your physician has requested that you have an echocardiogram. Echocardiography is a painless test that uses sound waves to create images of your heart. It provides your doctor with information about the size and shape of your heart and how well your heart's chambers and valves are working. This procedure takes approximately one hour. There are no restrictions for this procedure.  Your physician has recommended that you wear a 30 day event monitor. Event monitors are medical devices that record the heart's electrical activity. Doctors most often [place] these monitors to diagnose arrhythmias. Arrhythmias are problems with the speed or rhythm of the heartbeat. The monitor is a small, portable device. You can wear one while you do your normal daily activities. This is usually used to diagnose what is causing palpitations/syncope (passing out).  Office will contact with results via phone or letter.    Follow-Up: 2 months   Any Other Special Instructions Will Be Listed Below (If Applicable).  If you need a refill on your cardiac medications before your next appointment, please call your pharmacy.

## 2019-10-11 NOTE — Progress Notes (Signed)
CARDIOLOGY CONSULT NOTE  Patient ID: Jimmy Vasquez MRN: [id number] DOB/AGE: Nov 26, 1970 49 y.o.  Admit date: (Not on file) Primary Physician: Rory Percy, MD  Reason for Consultation: Chest pain  HPI: Jimmy Vasquez is a 49 y.o. male who is being seen today for the evaluation of chest pain at the request of Rory Percy, MD.   I reviewed records from his PCP.  He has a history of anxiety and used to see a psychiatrist.  He drinks about 4-5 beers every night.  He also has a history of gout.  I reviewed labs dated 09/08/2019: Total cholesterol 196, triglycerides 313, HDL 51, LDL 93, hemoglobin 16.7, platelets 243, white blood cells 5.6, BUN 13, creatinine 1.07, sodium 139, potassium 4.6, normal liver transaminases, TSH 3.  I personally reviewed an ECG performed on 09/08/2019 which demonstrated sinus rhythm with no ischemic ST segment or T wave abnormalities, nor any arrhythmias.  I reviewed a nuclear stress test dated 09/27/2019 which showed no evidence of reversible ischemia or infarction with mild diffuse hypokinesis and dyskinesis involving the proximal and mid inferior septum and proximal anterior septum with LVEF 44%.  He has been experiencing chest pain for several months which he said is getting worse.  2 months ago while at work he stood up and then felt nauseous and passed out.  This morning while he was at work he was sitting in a chair and then stood up and felt nauseous and had some left arm pain.  He stumbled on the hallway and passed out briefly.  He denies leg swelling, orthopnea, and paroxysmal nocturnal dyspnea.  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  He takes Nexium as needed for GERD.  I personally reviewed the ECG performed today which demonstrates sinus rhythm with LVH.  Allergies  Allergen Reactions  . Lamictal [Lamotrigine] Rash    Current Outpatient Medications  Medication Sig Dispense Refill  .  allopurinol (ZYLOPRIM) 300 MG tablet Take 300 mg by mouth daily.    . colchicine 0.6 MG tablet Take 0.6 mg by mouth daily as needed. Or as directed    . esomeprazole (NEXIUM) 40 MG capsule Take 1 capsule (40 mg total) by mouth 2 (two) times daily before a meal. 60 capsule 11  . venlafaxine (EFFEXOR) 75 MG tablet Take 75 mg by mouth daily.     No current facility-administered medications for this visit.    Past Medical History:  Diagnosis Date  . Colonic polyp    Hamartomatous colon polyp  . Depression   . Erosive esophagitis   . Gout   . Hiatal hernia     History reviewed. No pertinent surgical history.  Social History   Socioeconomic History  . Marital status: Legally Separated    Spouse name: Not on file  . Number of children: 2  . Years of education: Not on file  . Highest education level: Not on file  Occupational History  . Occupation: [employer]    Employer: [employer]  Tobacco Use  . Smoking status: Never Smoker  . Smokeless tobacco: Never Used  Substance and Sexual Activity  . Alcohol use: Yes    Comment: 6 beers per day  . Drug use: No  . Sexual activity: Not on file  Other Topics Concern  . Not on file  Social History Narrative   Separated, lives alone   2 children   OCCUPATION: fixer [employer]   Social  Determinants of Health   Financial Resource Strain:   . Difficulty of Paying Living Expenses:   Food Insecurity:   . Worried About Charity fundraiser in the Last Year:   . Arboriculturist in the Last Year:   Transportation Needs:   . Film/video editor (Medical):   Marland Kitchen Lack of Transportation (Non-Medical):   Physical Activity:   . Days of Exercise per Week:   . Minutes of Exercise per Session:   Stress:   . Feeling of Stress :   Social Connections:   . Frequency of Communication with Friends and Family:   . Frequency of Social Gatherings with Friends and Family:   . Attends Religious Services:   . Active Member of Clubs or  Organizations:   . Attends Archivist Meetings:   Marland Kitchen Marital Status:   Intimate Partner Violence:   . Fear of Current or Ex-Partner:   . Emotionally Abused:   Marland Kitchen Physically Abused:   . Sexually Abused:      No family history of premature CAD in 1st degree relatives.  Current Meds  Medication Sig  . allopurinol (ZYLOPRIM) 300 MG tablet Take 300 mg by mouth daily.  . colchicine 0.6 MG tablet Take 0.6 mg by mouth daily as needed. Or as directed  . esomeprazole (NEXIUM) 40 MG capsule Take 1 capsule (40 mg total) by mouth 2 (two) times daily before a meal.  . venlafaxine (EFFEXOR) 75 MG tablet Take 75 mg by mouth daily.      Review of systems complete and found to be negative unless listed above in HPI    Physical exam Blood pressure 132/90, pulse 93, height 6\' 2"  (1.88 m), weight 183 lb (83 kg), SpO2 98 %. General: NAD Neck: No JVD, no thyromegaly or thyroid nodule.  Lungs: Clear to auscultation bilaterally with normal respiratory effort. CV: Nondisplaced PMI. Regular rate and rhythm, normal S1/S2, no S3/+S4, no murmur.  No peripheral edema.  No carotid bruit.    Abdomen: Soft, nontender, no distention.  Skin: Intact without lesions or rashes.  Neurologic: Alert and oriented x 3.  Psych: Normal affect. Extremities: No clubbing or cyanosis.  HEENT: Normal.   ECG: Most recent ECG reviewed.   Labs: No results found for: K, BUN, CREATININE, ALT, TSH, HGB   Lipids: No results found for: LDLCALC, LDLDIRECT, CHOL, TRIG, HDL      ASSESSMENT AND PLAN:   1.  Chest pain: Unclear etiology.  Nuclear stress test showed no evidence of myocardial ischemia or scar with reduced LVEF of 44%.  I will obtain an echocardiogram to better characterize cardiac structure and function. Given his concomitant syncope, I still would not rule out the possibility of coronary angiography.  2.  Cardiomyopathy: LVEF 44% by nuclear stress test.  No evidence of myocardial ischemia or scar.  I  will obtain an echocardiogram for further assessment.  There is no clinical evidence of hypervolemia nor does he endorse orthopnea and paroxysmal nocturnal dyspnea.  3.  Syncope: It appears both episodes occurred shortly after standing which makes me wonder about an orthostatic etiology.  He is not on diuretics.  There is no history of palpitations.  I will obtain a 30-day event monitor.    Disposition: Follow up in  months  Signed: Kate Sable, M.D., F.A.C.C.  10/11/2019, 1:41 PM

## 2019-10-18 ENCOUNTER — Ambulatory Visit (INDEPENDENT_AMBULATORY_CARE_PROVIDER_SITE_OTHER): Payer: 59

## 2019-10-18 DIAGNOSIS — R55 Syncope and collapse: Secondary | ICD-10-CM

## 2019-11-10 ENCOUNTER — Ambulatory Visit (INDEPENDENT_AMBULATORY_CARE_PROVIDER_SITE_OTHER): Payer: 59

## 2019-11-10 DIAGNOSIS — R079 Chest pain, unspecified: Secondary | ICD-10-CM | POA: Diagnosis not present

## 2019-11-10 DIAGNOSIS — I429 Cardiomyopathy, unspecified: Secondary | ICD-10-CM

## 2019-11-16 ENCOUNTER — Telehealth: Payer: Self-pay | Admitting: *Deleted

## 2019-11-16 NOTE — Telephone Encounter (Signed)
Laurine Blazer, LPN  3/60/1658 0:06 AM EDT Back to Top    Notified, copy to pcp.

## 2019-11-16 NOTE — Telephone Encounter (Signed)
-----   Message from Arnoldo Lenis, MD sent at 11/15/2019  3:10 PM EDT ----- Echo overall looks good. Normal heart pumping function. Some mild stiffness of heart muscle which is common with aging, his aortic valve has a mild leak which is not significant just something to monitor with a repeat echo in a few years  Zandra Abts MD

## 2019-11-23 ENCOUNTER — Telehealth: Payer: Self-pay | Admitting: *Deleted

## 2019-11-23 NOTE — Telephone Encounter (Signed)
-----   Message from Erma Heritage, [place] sent at 11/23/2019 12:58 PM EDT ----- Patient of Dr. Bronson Ing - Please let the patient know his event monitor showed normal sinus rhythm with occasional episodes of sinus tachycardia but no significant arrhythmias. He did have occasional PAC's and PVC's (premature beats) but no significant pauses to have accounted for a syncopal episode. Please forward a copy of results to Rory Percy, MD.

## 2019-11-23 NOTE — Telephone Encounter (Signed)
Laurine Blazer, LPN  8/00/3491 7:91 PM EDT Back to Top    Notified, copy to pcp.

## 2019-12-21 ENCOUNTER — Ambulatory Visit: Payer: 59 | Admitting: Cardiovascular Disease

## 2019-12-23 ENCOUNTER — Encounter: Payer: Self-pay | Admitting: Family Medicine

## 2019-12-23 ENCOUNTER — Encounter: Payer: Self-pay | Admitting: *Deleted

## 2019-12-23 ENCOUNTER — Ambulatory Visit: Payer: 59 | Admitting: Family Medicine

## 2019-12-23 VITALS — BP 134/80 | HR 92 | Ht 74.0 in | Wt 183.6 lb

## 2019-12-23 DIAGNOSIS — R55 Syncope and collapse: Secondary | ICD-10-CM | POA: Diagnosis not present

## 2019-12-23 DIAGNOSIS — I429 Cardiomyopathy, unspecified: Secondary | ICD-10-CM | POA: Diagnosis not present

## 2019-12-23 DIAGNOSIS — R079 Chest pain, unspecified: Secondary | ICD-10-CM

## 2019-12-23 NOTE — Patient Instructions (Addendum)
Medication Instructions:   Your physician recommends that you continue on your current medications as directed. Please refer to the Current Medication list given to you today.  Labwork:  NONE  Testing/Procedures:  NONE  Follow-Up:  Your physician recommends that you schedule a follow-up appointment in: 3 months.  Any Other Special Instructions Will Be Listed Below (If Applicable).  If you need a refill on your cardiac medications before your next appointment, please call your pharmacy. 

## 2019-12-23 NOTE — Progress Notes (Signed)
Cardiology Office Note  Date: 12/23/2019   ID: Jimmy Vasquez, DOB [birth year], MRN [id number]  PCP:  Rory Percy, MD  Cardiologist:  No primary care provider on file. Electrophysiologist:  None   Chief Complaint: History of syncope/collapse  History of Present Illness: Jimmy Vasquez is a 49 y.o. male with a history of syncope/collapse, chest pain.  Last seen by Dr. Bronson Ing 10/11/2019 in consult at request of Dr. Rory Percy.  Admission experiencing chest pain for several months which he described as getting worse.  2 months prior to visit follow-up work he stood up and felt nauseous and passed out.  Had a second episode the day of consult while at work.  He was sitting in a chair and stood up felt nauseous with left arm pain.  Stumbled into the hallway and passed out briefly.  Dr. Bronson Ing reviewed his recent lab results, EKG, and nuclear stress test.  Patient had undergone nuclear stress test 09/27/2019 which showed no evidence of reversible ischemia or infarction with mild diffuse hypokinesis and dyskinesis involving proximal and mid inferior septum and proximal anterior septum with LVEF of 44%.  An echocardiogram was ordered as well as a 30-day event monitor.  Results are noted below.  Patient had no significant arrhythmias on cardiac monitor.  Echo showed EF 50%, grade 1 DD, mild to moderate AR.   Patient presents today for follow-up.  States he has had 1 subsequent episode since last visit where he got stood up, became dizzy, and fell against the wall but did not completely have a frank syncopal episode.  This apparently occurred at work.  Patient states they do have a nurse at work who can check vital signs.  I reviewed the results of the cardiac monitor and echocardiogram with patient.  He verbalizes understanding.  He is working 12-hour shifts 7 days a week and has a stressful job.  No PND, orthopnea, CVA or TIA-like symptoms, palpitations or arrhythmias.  Past Medical History:   Diagnosis Date  . Colonic polyp    Hamartomatous colon polyp  . Depression   . Erosive esophagitis   . Gout   . Hiatal hernia     No past surgical history on file.  Current Outpatient Medications  Medication Sig Dispense Refill  . allopurinol (ZYLOPRIM) 300 MG tablet Take 300 mg by mouth daily.    . colchicine 0.6 MG tablet Take 0.6 mg by mouth daily as needed. Or as directed    . esomeprazole (NEXIUM) 40 MG capsule Take 1 capsule (40 mg total) by mouth 2 (two) times daily before a meal. 60 capsule 11  . venlafaxine (EFFEXOR) 75 MG tablet Take 75 mg by mouth daily.     No current facility-administered medications for this visit.   Allergies:  Lamictal [lamotrigine]   Social History: The patient  reports that he has never smoked. He has never used smokeless tobacco. He reports current alcohol use. He reports that he does not use drugs.   Family History: The patient's family history includes Alzheimer's disease in his father, paternal grandfather, and paternal grandmother; Cancer in his maternal grandfather.   ROS:  Please see the history of present illness. Otherwise, complete review of systems is positive for none.  All other systems are reviewed and negative.   Physical Exam: VS:  BP (!) 134/80   Pulse 92   Ht 6\' 2"  (1.88 m)   Wt 183 lb 9.6 oz (83.3 kg)   SpO2 96%   BMI 23.57 kg/m ,  BMI Body mass index is 23.57 kg/m.  Wt Readings from Last 3 Encounters:  12/23/19 183 lb 9.6 oz (83.3 kg)  10/11/19 183 lb (83 kg)  06/04/16 183 lb 12.8 oz (83.4 kg)    General: Patient appears comfortable at rest. Neck: Supple, no elevated JVP or carotid bruits, no thyromegaly. Lungs: Clear to auscultation, nonlabored breathing at rest. Cardiac: Regular rate and rhythm, no S3 or significant systolic murmur, no pericardial rub. Extremities: No pitting edema, distal pulses 2+. Skin: Warm and dry. Musculoskeletal: No kyphosis. Neuropsychiatric: Alert and oriented x3, affect grossly  appropriate.  ECG:  EKG Oct 11, 2019 normal sinus rhythm rate of 88, voltage criteria for LVH.  Recent Labwork: No results found for requested labs within last 8760 hours.  No results found for: CHOL, TRIG, HDL, CHOLHDL, VLDL, LDLCALC, LDLDIRECT  Other Studies Reviewed Today:  Cardiac non telemetry monitor 11/22/2019  Study Highlights  [employer] monitor reviewed, 30 days analyzed.  End of service summary reviewed.  Predominant rhythm is sinus with heart rate ranging from 61 bpm up to 172 bpm and average heart rate 94 bpm.  Fastest heart rates were in the 150s to 170s and looked to be sinus, cannot visualize onset and offset of tachycardia with strips provided however.  Rare PACs and PVCs noted.  No pauses.   Echocardiogram 11/10/2019  1. Left ventricular ejection fraction, by estimation, is 50%. The left ventricle has normal function. The left ventricle has no regional wall motion abnormalities. Left ventricular diastolic parameters are consistent with Grade I diastolic dysfunction (impaired relaxation). 2. Right ventricular systolic function is normal. The right ventricular size is normal. There is normal pulmonary artery systolic pressure. 3. The mitral valve is normal in structure. No evidence of mitral valve regurgitation. No evidence of mitral stenosis. 4. The aortic valve has an indeterminant number of cusps. Aortic valve regurgitation is mild to moderate. No aortic stenosis is present. 5. The inferior vena cava is normal in size with greater than 50% respiratory variability, suggesting right atrial pressure of 3 mmHg. Comparison(s): Nuclear stress test done 09/26/16 showed an EF of 44%  Assessment and Plan:  1. Chest pain, unspecified type   2. Cardiomyopathy, unspecified type ([place])   3. Syncope and collapse    1. Chest pain, unspecified type Patient states he has an occasional left precordial chest pain very focused in origin to one location in his chest without radiation.   Denies any associated anginal or exertional symptoms.  States the pains are very short in duration without radiation or associated nausea, vomiting, diaphoresis, dizziness.  2. Cardiomyopathy, unspecified type ([place]) Recent echocardiogram shows EF in the low normal range at 50%.  EF in comparison on nuclear stress test on 10/12/2016 was 44%.  3. Syncope and collapse He states he had 1 mild brief episode of what he describes as a near syncopal episode which caused him to fall against the wall but did not completely pass out.  This occurred at work.  Patient has been working 712-hour shifts weekly without any breaks.  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  States that sometimes can be hot in the area where he works.  From what he describes it sounds like this may be vagal in origin.  He describes an episode of nausea preceding one of the last near syncopal episodes.  Advised him to stay well-hydrated during the day at work.  Advised to report any other subsequent syncopal episodes.  Medication Adjustments/Labs and Tests Ordered: Current medicines  are reviewed at length with the patient today.  Concerns regarding medicines are outlined above.   Disposition: Follow-up with Dr. Domenic Polite or APP 3 months  Signed, Levell July, NP 12/23/2019 5:18 PM    [facility] at [place], [place], [place] [address] Phone: [phone]; Fax: [phone]

## 2020-01-09 ENCOUNTER — Encounter: Payer: Self-pay | Admitting: *Deleted

## 2020-01-10 ENCOUNTER — Other Ambulatory Visit: Payer: Self-pay

## 2020-01-10 ENCOUNTER — Encounter: Payer: Self-pay | Admitting: Diagnostic Neuroimaging

## 2020-01-10 ENCOUNTER — Ambulatory Visit: Payer: 59 | Admitting: Diagnostic Neuroimaging

## 2020-01-10 VITALS — BP 126/82 | HR 74 | Ht 74.0 in | Wt 181.0 lb

## 2020-01-10 DIAGNOSIS — R55 Syncope and collapse: Secondary | ICD-10-CM

## 2020-01-10 NOTE — Patient Instructions (Signed)
-   continue medical mgmt - continue to reduce alcohol use - follow up with PCP and cardiology for further eval and mgmt

## 2020-01-10 NOTE — Progress Notes (Signed)
[facility]  PATIENT: Jimmy Vasquez DOB: 01-Aug-1970  REFERRING CLINICIAN: Allwardt, Alyssa, PA HISTORY FROM: patient  REASON FOR VISIT: new consult    HISTORICAL  CHIEF COMPLAINT:  Chief Complaint  Patient presents with  . Syncope and Collapse    rm 6 New Pt  "passed out x 3 this year, wake at 2 am to be at work at 4 am for 12 hour shifts, 1 episode upon waking at 2 am, fell into wall at work one day- didn't lose total consciousness, same type event at home x 1"  . 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  March 2021 patient had episode of loss of consciousness.  No postictal confusion.  May 2021 patient had near loss of consciousness he was shortness of breath.  June 2021 patient had another episode of near loss of consciousness.  No specific triggering or aggravating factors.  Patient typically feels lightheaded, hot dizzy immediately before the event.  No history of seizures.  No tongue biting or incontinence.  No postictal confusion.  Patient has history of anxiety, cardiomyopathy, chest pain.  Has been working regular schedule of 7 days/week, 12-hour shifts since 2019.  He averages only 2 days off per month.  Patient previously was drinking 4-5 beers per day.  He he has cut down to 2 drinks per day in the last 1 month.   REVIEW OF SYSTEMS: Full 14 system review of systems performed and negative with exception of: As per HPI.  ALLERGIES: Allergies  Allergen Reactions  . Lamictal [Lamotrigine] Other (See Comments)    "fever of 105 x 3 days"    HOME MEDICATIONS: Outpatient Medications Prior to Visit  Medication Sig Dispense Refill  . allopurinol (ZYLOPRIM) 300 MG tablet Take 300 mg by mouth daily.    Marland Kitchen venlafaxine XR (EFFEXOR-XR) 150 MG 24 hr  capsule Take 150 mg by mouth daily.    Marland Kitchen venlafaxine (EFFEXOR) 75 MG tablet Take 75 mg by mouth daily.    . colchicine 0.6 MG tablet Take 0.6 mg by mouth daily as needed. Or as directed (Patient not taking: Reported on 01/10/2020)    . esomeprazole (NEXIUM) 40 MG capsule Take 1 capsule (40 mg total) by mouth 2 (two) times daily before a meal. (Patient not taking: Reported on 01/10/2020) 60 capsule 11   No facility-administered medications prior to visit.    PAST MEDICAL HISTORY: Past Medical History:  Diagnosis Date  . Anxiety   . Colonic polyp    Hamartomatous colon polyp  . Depression   . Erosive esophagitis   . Fatigue   . GERD (gastroesophageal reflux disease)   . Gout   . Headache   . Hiatal hernia   . Syncope and collapse   . Vitamin B12 deficiency   . Vitamin D deficiency     PAST SURGICAL HISTORY: Past Surgical History:  Procedure Laterality Date  . COLONOSCOPY      FAMILY HISTORY: Family History  Problem Relation Age of Onset  . Alzheimer's disease Father   . Cancer Maternal Grandfather   . Alzheimer's disease Paternal Grandmother   . Alzheimer's disease Paternal Grandfather     SOCIAL HISTORY: Social History   Socioeconomic History  . Marital status: Divorced    Spouse  name: Not on file  . Number of children: 2  . Years of education: Not on file  . Highest education level: Some college, no degree  Occupational History  . Occupation: [employer]    Employer: [employer]    Comment: 12 hr shifts, [employer]  Tobacco Use  . Smoking status: Never Smoker  . Smokeless tobacco: Never Used  Substance and Sexual Activity  . Alcohol use: Yes    Comment: 6 beers on some days  . Drug use: No  . Sexual activity: Not on file  Other Topics Concern  . Not on file  Social History Narrative   lives alone   2 children   OCCUPATION: fixer [employer]   Caffeine 1 daily   Social Determinants of Health   Financial Resource Strain:   . Difficulty of Paying  Living Expenses:   Food Insecurity:   . Worried About Charity fundraiser in the Last Year:   . Arboriculturist in the Last Year:   Transportation Needs:   . Film/video editor (Medical):   Marland Kitchen Lack of Transportation (Non-Medical):   Physical Activity:   . Days of Exercise per Week:   . Minutes of Exercise per Session:   Stress:   . Feeling of Stress :   Social Connections:   . Frequency of Communication with Friends and Family:   . Frequency of Social Gatherings with Friends and Family:   . Attends Religious Services:   . Active Member of Clubs or Organizations:   . Attends Archivist Meetings:   Marland Kitchen Marital Status:   Intimate Partner Violence:   . Fear of Current or Ex-Partner:   . Emotionally Abused:   Marland Kitchen Physically Abused:   . Sexually Abused:      PHYSICAL EXAM  GENERAL EXAM/CONSTITUTIONAL: Vitals:  Vitals:   01/10/20 1432  BP: 126/82  Pulse: 74  Weight: 181 lb (82.1 kg)  Height: 6\' 2"  (1.88 m)     Body mass index is 23.24 kg/m. Wt Readings from Last 3 Encounters:  01/10/20 181 lb (82.1 kg)  12/23/19 183 lb 9.6 oz (83.3 kg)  10/11/19 183 lb (83 kg)     Patient is in no distress; well developed, nourished and groomed; neck is supple  CARDIOVASCULAR:  Examination of carotid arteries is normal; no carotid bruits  Regular rate and rhythm, no murmurs  Examination of peripheral vascular system by observation and palpation is normal  EYES:  Ophthalmoscopic exam of optic discs and posterior segments is normal; no papilledema or hemorrhages  No exam data present  MUSCULOSKELETAL:  Gait, strength, tone, movements noted in Neurologic exam below  NEUROLOGIC: MENTAL STATUS:  No flowsheet data found.  awake, alert, oriented to person, place and time  recent and remote memory intact  normal attention and concentration  language fluent, comprehension intact, naming intact  fund of knowledge appropriate  CRANIAL NERVE:   2nd - no  papilledema on fundoscopic exam  2nd, 3rd, 4th, 6th - pupils equal and reactive to light, visual fields full to confrontation, extraocular muscles intact, no nystagmus  5th - facial sensation symmetric  7th - facial strength symmetric  8th - hearing intact  9th - palate elevates symmetrically, uvula midline  11th - shoulder shrug symmetric  12th - tongue protrusion midline  MOTOR:   normal bulk and tone, full strength in the BUE, BLE  SENSORY:   normal and symmetric to light touch, temperature, vibration  COORDINATION:  finger-nose-finger, fine finger movements normal  REFLEXES:   deep tendon reflexes present and symmetric  GAIT/STATION:   narrow based gait     DIAGNOSTIC DATA (LABS, IMAGING, TESTING) - I reviewed patient records, labs, notes, testing and imaging myself where available.  No results found for: WBC, HGB, HCT, MCV, PLT No results found for: NA, K, CL, CO2, GLUCOSE, BUN, CREATININE, CALCIUM, PROT, ALBUMIN, AST, ALT, ALKPHOS, BILITOT, GFRNONAA, GFRAA No results found for: CHOL, HDL, LDLCALC, LDLDIRECT, TRIG, CHOLHDL No results found for: HGBA1C No results found for: VITAMINB12 No results found for: TSH  09/20/19 MRI brain [report only] - No acute intracranial abnormality. Few small deep white matter hyperintensities may be related to chronic microvascular ischemia or complex migraine headache  Sinus mucosal disease.    ASSESSMENT AND PLAN  49 y.o. year old male here with:  Dx:  1. Recurrent syncope      PLAN:  RECURRENT SYNCOPE / NEAR SYNCOPE (unlikely related to stroke or seizure) - possibly vasovagal; h/o CAD and etoh abuse - continue medical mgmt - continue to reduce alcohol use - follow up with PCP and cardiology for further eval and mgmt  Return for pending if symptoms worsen or fail to improve, return to PCP.    Penni Bombard, MD 3/50/0938, 1:82 PM Certified in Neurology, Neurophysiology and Neuroimaging  [facility] [address] [place], [place] [address] [phone]

## 2020-01-31 ENCOUNTER — Ambulatory Visit: Payer: 59 | Admitting: Neurology

## 2020-03-27 ENCOUNTER — Ambulatory Visit: Payer: 59 | Admitting: Cardiology

## 2021-02-05 ENCOUNTER — Encounter: Payer: Self-pay | Admitting: Gastroenterology

## 2021-03-01 ENCOUNTER — Encounter: Payer: Self-pay | Admitting: Gastroenterology

## 2021-03-01 ENCOUNTER — Ambulatory Visit: Payer: 59 | Admitting: Gastroenterology

## 2021-03-01 VITALS — BP 122/80 | HR 94 | Ht 73.0 in | Wt 179.0 lb

## 2021-03-01 DIAGNOSIS — K760 Fatty (change of) liver, not elsewhere classified: Secondary | ICD-10-CM | POA: Diagnosis not present

## 2021-03-01 DIAGNOSIS — R7989 Other specified abnormal findings of blood chemistry: Secondary | ICD-10-CM

## 2021-03-01 NOTE — Progress Notes (Signed)
Reviewed and agree with management plan. Colonoscopy for average risk CRC screening for 10 years, May 2023.  Pricilla Riffle. Fuller Plan, MD [facility]

## 2021-03-01 NOTE — Progress Notes (Signed)
03/01/2021 Jimmy Vasquez [id number] 1971-02-28   HISTORY OF PRESENT ILLNESS: This is a 50 year old male who is known to Dr. Fuller Plan from EGD and colonoscopy in 2013.  He presents here today at the request of his PCP, Dr. Stana Bunting, for evaluation regarding elevated LFTs and fatty liver.  He tells me that he was first made aware of the elevated LFTs about 6 months ago.  The only labs that I have are from August at which time AST was 51 and ALT was 53.  Total bili is normal at 0.3 and alk phos is normal at 95.  He had an ultrasound performed in April that showed probable hepatic steatosis with focal fatty sparing adjacent to the gallbladder fossa.  It also showed the gallbladder wall measured 5.1 mm which is thickened but could be due to to poor distention.  Gallbladder was otherwise normal.  He tells me that his mother also has fatty liver so he has been taking milk thistle at her suggestion.  He does drink alcohol, about 6 beers daily.  He tells me that his PCP has done extensive blood work and has checked him for viral hepatitis on a couple of occasions.  He says that he was having some right upper quadrant abdominal pain or discomfort, but that has resolved and he has not had any of any of that in a couple of months.  He had colonoscopy in May 2013 and had hamartomatous polyps removed.  Repeat was recommended at one point said 10 years and another point said at age 1.  He says that he is not having any issues with moving his bowels and no rectal bleeding.  He would prefer to wait until next year at the 10-year mark to have that performed.   Past Medical History:  Diagnosis Date   Anxiety    Colonic polyp    Hamartomatous colon polyp   Depression    Erosive esophagitis    Fatigue    GERD (gastroesophageal reflux disease)    Gout    Headache    Hiatal hernia    Syncope and collapse    Vitamin B12 deficiency    Vitamin D deficiency    Past Surgical History:  Procedure Laterality  Date   COLONOSCOPY      reports that he has never smoked. He has never used smokeless tobacco. He reports current alcohol use. He reports that he does not use drugs. family history includes Alzheimer's disease in his father, paternal grandfather, and paternal grandmother; Cancer in his maternal grandfather. Allergies  Allergen Reactions   Lamictal [Lamotrigine] Other (See Comments)    "fever of 105 x 3 days"      Outpatient Encounter Medications as of 03/01/2021  Medication Sig   allopurinol (ZYLOPRIM) 300 MG tablet Take 300 mg by mouth daily.   colchicine 0.6 MG tablet Take 0.6 mg by mouth daily as needed. Or as directed   esomeprazole (NEXIUM) 40 MG capsule Take 1 capsule (40 mg total) by mouth 2 (two) times daily before a meal.   venlafaxine XR (EFFEXOR-XR) 150 MG 24 hr capsule Take 150 mg by mouth daily.   No facility-administered encounter medications on file as of 03/01/2021.     REVIEW OF SYSTEMS  : All other systems reviewed and negative except where noted in the History of Present Illness.   PHYSICAL EXAM: BP 122/80   Pulse 94   Ht _0  (1.854 m)   Wt 179 lb (81.2 kg)  SpO2 97%   BMI 23.62 kg/m  General: Well developed white male in no acute distress Head: Normocephalic and atraumatic Eyes:  Sclerae anicteric, conjunctiva pink. Ears: Normal auditory acuity Lungs: Clear throughout to auscultation; no W/R/R. Heart: Regular rate and rhythm; no M/R/G. Abdomen: Soft, non-distended.  BS present.  Non-tender. Musculoskeletal: Symmetrical with no gross deformities  Skin: No lesions on visible extremities Extremities: No edema  Neurological: Alert oriented x 4, grossly normal. Psychological:  Alert and cooperative. Normal mood and affect  ASSESSMENT AND PLAN: *Elevated LFTs: Minimal elevation in AST and ALT, both of them in the 50s.  This is likely secondary to hepatic steatosis that was seen on ultrasound and his alcohol use.  He says that he has had extensive labs  performed by his PCP including viral hepatitis studies, etc.  First told about the elevation about 6 months ago.  I am going to request any labs that he has had done over the past 6 months. I think that the only other thing that I would perform if it has not already been done are iron studies.  He reports fatty liver in his mother as well.  We discussed good diet, exercise, good cholesterol and blood sugar control as well as significantly decreasing his alcohol intake.  He is taking milk thistle. *Right upper quadrant abdominal pain: Was having some discomfort, but has not had any in a couple of months.  Fatty liver does not tend to cause pain.  Ultrasound did mention thickened gallbladder wall at 5.1 mm but could be due to poor distention and was otherwise normal.  He said that this could have been musculoskeletal from a muscle strain or something at work.  If it is recurrent and is suspicious for gallbladder etiology then could consider repeat ultrasound and/or HIDA scan. *Colorectal cancer screening: Had colonoscopy in May 2013 with hamartomatous polyps removed.  He was initially told to repeat in 10 years and then it was mentioned to have him repeat age 17.  He would like to hold off until next year at the 10-year mark.  CC:  Rory Percy, MD CC:  Dr. Stana Bunting

## 2021-03-01 NOTE — Patient Instructions (Signed)
If you are age 51 or younger, your body mass index should be between 19-25. Your Body mass index is 23.62 kg/m. If this is out of the aformentioned range listed, please consider follow up with your Primary Care Provider.  __________________________________________________________  The [facility] providers would like to encourage you to use [facility] to communicate with providers for non-urgent requests or questions.  Due to long hold times on the telephone, sending your provider a message by [facility] may be a faster and more efficient way to get a response.  Please allow 48 business hours for a response.  Please remember that this is for non-urgent requests.   We have requested labs from your primary care provider for the past 6 months, if we see that we need to order any more labs we will contact you and let know.  You will be due for your Colonoscopy in May 2023.  Please work on decreasing your alcohol use.  Thank you for entrusting me with your care and choosing [facility].  Alonza Bogus, PA-C

## 2022-09-05 ENCOUNTER — Ambulatory Visit (AMBULATORY_SURGERY_CENTER): Payer: 59

## 2022-09-05 VITALS — Ht 74.0 in | Wt 172.0 lb

## 2022-09-05 DIAGNOSIS — Z1211 Encounter for screening for malignant neoplasm of colon: Secondary | ICD-10-CM

## 2022-09-05 MED ORDER — NA SULFATE-K SULFATE-MG SULF 17.5-3.13-1.6 GM/177ML PO SOLN: 1.0000 | Freq: Once | ORAL | 0 refills | Status: AC

## 2022-09-05 NOTE — Progress Notes (Signed)
No egg or soy allergy known to patient  No issues known to pt with past sedation with any surgeries or procedures Patient denies ever being told they had issues or difficulty with intubation  No FH of Malignant Hyperthermia Pt is not on diet pills Pt is not on  home 02  Pt is not on blood thinners  Pt denies issues with constipation  No A fib or A flutter Have any cardiac testing pending--no Pt instructed to use Singlecare.[email or web address] or GoodRx for a price reduction on prep   

## 2022-09-17 ENCOUNTER — Encounter: Payer: Self-pay | Admitting: Gastroenterology

## 2022-09-29 ENCOUNTER — Ambulatory Visit (AMBULATORY_SURGERY_CENTER): Payer: 59 | Admitting: Gastroenterology

## 2022-09-29 ENCOUNTER — Encounter: Payer: Self-pay | Admitting: Gastroenterology

## 2022-09-29 VITALS — BP 118/74 | HR 72 | Temp 98.0°F | Resp 12 | Ht 74.0 in | Wt 172.0 lb

## 2022-09-29 DIAGNOSIS — Z1211 Encounter for screening for malignant neoplasm of colon: Secondary | ICD-10-CM

## 2022-09-29 DIAGNOSIS — D123 Benign neoplasm of transverse colon: Secondary | ICD-10-CM | POA: Diagnosis not present

## 2022-09-29 MED ORDER — SODIUM CHLORIDE 0.9 % IV SOLN: 500.0000 mL | Freq: Once | INTRAVENOUS | Status: DC

## 2022-09-29 NOTE — Progress Notes (Signed)
History & Physical  Primary Care Physician:  Donetta Potts, MD Primary Gastroenterologist: Claudette Head, MD  Impression / Plan:  Average risk CRC screening for colonoscopy.  CHIEF COMPLAINT:  CRC screening   HPI: Jimmy Vasquez is a 52 y.o. male average risk CRC screening for colonoscopy.   Past Medical History:  Diagnosis Date   Anxiety    Colonic polyp    Hamartomatous colon polyp   Depression    Erosive esophagitis    Fatigue    GERD (gastroesophageal reflux disease)    Gout    Headache    Hiatal hernia    Syncope and collapse    Vitamin B12 deficiency    Vitamin D deficiency     Past Surgical History:  Procedure Laterality Date   COLONOSCOPY      Prior to Admission medications   Medication Sig Start Date End Date Taking? Authorizing Provider  allopurinol (ZYLOPRIM) 300 MG tablet Take 300 mg by mouth daily.    [provider]  colchicine 0.6 MG tablet Take 0.6 mg by mouth daily as needed. Or as directed Patient not taking: Reported on 09/05/2022    [provider]  esomeprazole (NEXIUM) 40 MG capsule Take 1 capsule (40 mg total) by mouth 2 (two) times daily before a meal. Patient not taking: Reported on 09/05/2022 06/04/16   Meryl Dare, MD  meloxicam (MOBIC) 15 MG tablet Take 15 mg by mouth daily. 09/10/22   [provider]  venlafaxine XR (EFFEXOR-XR) 150 MG 24 hr capsule Take 150 mg by mouth daily. Patient not taking: Reported on 09/05/2022 12/22/19   [provider]    Current Outpatient Medications  Medication Sig Dispense Refill   allopurinol (ZYLOPRIM) 300 MG tablet Take 300 mg by mouth daily.     colchicine 0.6 MG tablet Take 0.6 mg by mouth daily as needed. Or as directed (Patient not taking: Reported on 09/05/2022)     esomeprazole (NEXIUM) 40 MG capsule Take 1 capsule (40 mg total) by mouth 2 (two) times daily before a meal. (Patient not taking: Reported on 09/05/2022) 60 capsule 11   meloxicam (MOBIC) 15 MG  tablet Take 15 mg by mouth daily.     venlafaxine XR (EFFEXOR-XR) 150 MG 24 hr capsule Take 150 mg by mouth daily. (Patient not taking: Reported on 09/05/2022)     Current Facility-Administered Medications  Medication Dose Route Frequency Provider Last Rate Last Admin   0.9 %  sodium chloride infusion  500 mL Intravenous Once Meryl Dare, MD        Allergies as of 09/29/2022 - Review Complete 09/29/2022  Allergen Reaction Noted   Lamictal [lamotrigine] Other (See Comments) 04/15/2011    Family History  Problem Relation Age of Onset   Alzheimer's disease Father    Cancer Maternal Grandfather    Alzheimer's disease Paternal Grandmother    Alzheimer's disease Paternal Grandfather    Colon cancer Neg Hx    Pancreatic cancer Neg Hx    Liver cancer Neg Hx     Social History   Socioeconomic History   Marital status: Divorced    Spouse name: Not on file   Number of children: 2   Years of education: Not on file   Highest education level: Some college, no degree  Occupational History   Occupation: [employer]    Employer: [employer]    Comment: 12 hr shifts, [employer]  Tobacco Use   Smoking status: Never   Smokeless tobacco:  Never  Substance and Sexual Activity   Alcohol use: Yes    Comment: 6 beers on some days   Drug use: No   Sexual activity: Not on file  Other Topics Concern   Not on file  Social History Narrative   lives alone   2 children   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   Physical Exam: General:  Alert, well-developed, in NAD Head:  Normocephalic and atraumatic. Eyes:  Sclera  clear, no icterus.   Conjunctiva pink. Ears:  Normal auditory acuity. Mouth:  No deformity or lesions.  Neck:  Supple; no masses. Lungs:  Clear throughout to auscultation.   No wheezes, crackles, or rhonchi.  Heart:  Regular rate and rhythm; no murmurs. Abdomen:  Soft, nondistended, nontender. No masses, hepatomegaly. No palpable masses.  Normal bowel sounds.    Rectal:  Deferred   Msk:  Symmetrical without gross deformities. Extremities:  Without edema. Neurologic:  Alert and  oriented x 4; grossly normal neurologically. Skin:  Intact without significant lesions or rashes. Psych:  Alert and cooperative. Normal mood and affect.   Venita Lick. Russella Dar  09/29/2022, 8:49 AM See Loretha Stapler, [facility], to contact our on call provider

## 2022-09-29 NOTE — Patient Instructions (Signed)
Await pathology results.  Handouts on polyps and hemorrhoids provided.  YOU HAD AN ENDOSCOPIC PROCEDURE TODAY AT THE [facility]:   Refer to the procedure report that was given to you for any specific questions about what was found during the examination.  If the procedure report does not answer your questions, please call your gastroenterologist to clarify.  If you requested that your care partner not be given the details of your procedure findings, then the procedure report has been included in a sealed envelope for you to review at your convenience later.  YOU SHOULD EXPECT: Some feelings of bloating in the abdomen. Passage of more gas than usual.  Walking can help get rid of the air that was put into your GI tract during the procedure and reduce the bloating. If you had a lower endoscopy (such as a colonoscopy or flexible sigmoidoscopy) you may notice spotting of blood in your stool or on the toilet paper. If you underwent a bowel prep for your procedure, you may not have a normal bowel movement for a few days.  Please Note:  You might notice some irritation and congestion in your nose or some drainage.  This is from the oxygen used during your procedure.  There is no need for concern and it should clear up in a day or so.  SYMPTOMS TO REPORT IMMEDIATELY:  Following lower endoscopy (colonoscopy or flexible sigmoidoscopy):  Excessive amounts of blood in the stool  Significant tenderness or worsening of abdominal pains  Swelling of the abdomen that is new, acute  Fever of 100F or higher   For urgent or emergent issues, a gastroenterologist can be reached at any hour by calling [phone]. Do not use MyChart messaging for urgent concerns.    DIET:  We do recommend a small meal at first, but then you may proceed to your regular diet.  Drink plenty of fluids but you should avoid alcoholic beverages for 24 hours.  ACTIVITY:  You should plan to take it easy for the rest of  today and you should NOT DRIVE or use heavy machinery until tomorrow (because of the sedation medicines used during the test).    FOLLOW UP: Our staff will call the number listed on your records the next business day following your procedure.  We will call around 7:15- 8:00 am to check on you and address any questions or concerns that you may have regarding the information given to you following your procedure. If we do not reach you, we will leave a message.     If any biopsies were taken you will be contacted by phone or by letter within the next 1-3 weeks.  Please call us at [phone] if you have not heard about the biopsies in 3 weeks.    SIGNATURES/CONFIDENTIALITY: You and/or your care partner have signed paperwork which will be entered into your electronic medical record.  These signatures attest to the fact that that the information above on your After Visit Summary has been reviewed and is understood.  Full responsibility of the confidentiality of this discharge information lies with you and/or your care-partner.  

## 2022-09-29 NOTE — Op Note (Signed)
[facility] Patient Name: Jimmy Vasquez Procedure Date: 09/29/2022 8:50 AM MRN: [id number] Endoscopist: Meryl Dare , MD, [phone] Age: 52 Referring MD:  Date of Birth: Feb 25, 1971 Gender: Male Account #: [id number] Procedure:                Colonoscopy Indications:              Screening for colorectal malignant neoplasm Medicines:                Monitored Anesthesia Care Procedure:                Pre-Anesthesia Assessment:                           - Prior to the procedure, a History and Physical                            was performed, and patient medications and                            allergies were reviewed. The patient's tolerance of                            previous anesthesia was also reviewed. The risks                            and benefits of the procedure and the sedation                            options and risks were discussed with the patient.                            All questions were answered, and informed consent                            was obtained. Prior Anticoagulants: The patient has                            taken no anticoagulant or antiplatelet agents. ASA                            Grade Assessment: II - A patient with mild systemic                            disease. After reviewing the risks and benefits,                            the patient was deemed in satisfactory condition to                            undergo the procedure.                           After obtaining informed consent, the colonoscope  was passed under direct vision. Throughout the                            procedure, the patient's blood pressure, pulse, and                            oxygen saturations were monitored continuously. The                            Olympus CF-HQ190L ([id number]) Colonoscope was                            introduced through the anus and advanced to the the                            cecum, identified  by appendiceal orifice and                            ileocecal valve. The ileocecal valve, appendiceal                            orifice, and rectum were photographed. The quality                            of the bowel preparation was good. The colonoscopy                            was performed without difficulty. The patient                            tolerated the procedure well. Scope In: 8:56:46 AM Scope Out: 9:11:45 AM Scope Withdrawal Time: 0 hours 12 minutes 47 seconds  Total Procedure Duration: 0 hours 14 minutes 59 seconds  Findings:                 The perianal and digital rectal examinations were                            normal.                           A 7 mm polyp was found in the transverse colon. The                            polyp was sessile. The polyp was removed with a                            cold snare. Resection and retrieval were complete.                           Internal hemorrhoids were found during                            retroflexion. The hemorrhoids were Grade I                            (  internal hemorrhoids that do not prolapse).                           The exam was otherwise without abnormality on                            direct and retroflexion views. Complications:            No immediate complications. Estimated blood loss:                            None. Estimated Blood Loss:     Estimated blood loss: none. Impression:               - One 7 mm polyp in the transverse colon, removed                            with a cold snare. Resected and retrieved.                           - Small internal hemorrhoids.                           - The examination was otherwise normal on direct                            and retroflexion views. Recommendation:           - Repeat colonoscopy after studies are complete for                            surveillance based on pathology results.                           - Patient has a contact number  available for                            emergencies. The signs and symptoms of potential                            delayed complications were discussed with the                            patient. Return to normal activities tomorrow.                            Written discharge instructions were provided to the                            patient.                           - Resume previous diet.                           - Continue present medications.                           -  Await pathology results. Meryl Dare, MD 09/29/2022 9:17:12 AM This report has been signed electronically.

## 2022-09-29 NOTE — Progress Notes (Signed)
Report to PACU, RN, vss, BBS= Clear.  

## 2022-09-29 NOTE — Progress Notes (Signed)
Pt's states no medical or surgical changes since previsit or office visit. 

## 2022-09-29 NOTE — Progress Notes (Signed)
Called to room to assist during endoscopic procedure.  Patient ID and intended procedure confirmed with present staff. Received instructions for my participation in the procedure from the performing physician.  

## 2022-09-30 ENCOUNTER — Telehealth: Payer: Self-pay

## 2022-09-30 NOTE — Telephone Encounter (Signed)
  Follow up Call-     09/29/2022    8:06 AM  Call back number  Post procedure Call Back phone  # [phone]  Permission to leave phone message Yes     Patient questions:  Do you have a fever, pain , or abdominal swelling? No. Pain Score  0 *  Have you tolerated food without any problems? Yes.    Have you been able to return to your normal activities? Yes.    Do you have any questions about your discharge instructions: Diet   No. Medications  No. Follow up visit  No.  Do you have questions or concerns about your Care? No.  Actions: * If pain score is 4 or above: No action needed, pain <4.

## 2022-10-16 ENCOUNTER — Encounter: Payer: Self-pay | Admitting: Gastroenterology
